# Patient Record
Sex: Male | Born: 1985 | Race: Black or African American | Hispanic: No | Marital: Single | Smoking: Former smoker
Health system: Southern US, Community
[De-identification: ages and names within clinical notes are randomized; demographics above are authoritative.]

## PROBLEM LIST (undated history)

## (undated) DIAGNOSIS — F32A Depression, unspecified: Secondary | ICD-10-CM

## (undated) HISTORY — PX: TOE SURGERY: SHX1073

## (undated) HISTORY — DX: Depression, unspecified: F32.A

## (undated) HISTORY — PX: EYE SURGERY: SHX253

---
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
## 2015-10-13 ENCOUNTER — Encounter (HOSPITAL_COMMUNITY): Payer: Self-pay | Admitting: Emergency Medicine

## 2015-10-13 ENCOUNTER — Emergency Department (HOSPITAL_COMMUNITY)
Admission: EM | Admit: 2015-10-13 | Discharge: 2015-10-13 | Disposition: A | Discharge status: Home / Self Care | Payer: Self-pay | Attending: Emergency Medicine | Admitting: Emergency Medicine

## 2015-10-13 DIAGNOSIS — F17210 Nicotine dependence, cigarettes, uncomplicated: Secondary | ICD-10-CM | POA: Insufficient documentation

## 2015-10-13 DIAGNOSIS — H00015 Hordeolum externum left lower eyelid: Secondary | ICD-10-CM | POA: Insufficient documentation

## 2015-10-13 DIAGNOSIS — R51 Headache: Secondary | ICD-10-CM | POA: Insufficient documentation

## 2015-10-13 DIAGNOSIS — H00016 Hordeolum externum left eye, unspecified eyelid: Secondary | ICD-10-CM

## 2015-10-13 MED ORDER — ERYTHROMYCIN 5 MG/GM OP OINT: TOPICAL_OINTMENT | OPHTHALMIC | Status: DC

## 2015-10-13 NOTE — Discharge Instructions (Signed)
Stye A stye is a bump on your eyelid caused by a bacterial infection. A stye can form inside the eyelid (internal stye) or outside the eyelid (external stye). An internal stye may be caused by an infected oil-producing gland inside your eyelid. An external stye may be caused by an infection at the base of your eyelash (hair follicle). Styes are very common. Anyone can get them at any age. They usually occur in just one eye, but you may have more than one in either eye.  CAUSES  The infection is almost always caused by bacteria called Staphylococcus aureus. This is a common type of bacteria that lives on your skin. RISK FACTORS You may be at higher risk for a stye if you have had one before. You may also be at higher risk if you have:  Diabetes.  Long-term illness.  Long-term eye redness.  A skin condition called seborrhea.  High fat levels in your blood (lipids). SIGNS AND SYMPTOMS  Eyelid pain is the most common symptom of a stye. Internal styes are more painful than external styes. Other signs and symptoms may include:  Painful swelling of your eyelid.  A scratchy feeling in your eye.  Tearing and redness of your eye.  Pus draining from the stye. DIAGNOSIS  Your health care provider may be able to diagnose a stye just by examining your eye. The health care provider may also check to make sure:  You do not have a fever or other signs of a more serious infection.  The infection has not spread to other parts of your eye or areas around your eye. TREATMENT  Most styes will clear up in a few days without treatment. In some cases, you may need to use antibiotic drops or ointment to prevent infection. Your health care provider may have to drain the stye surgically if your stye is:  Large.  Causing a lot of pain.  Interfering with your vision. This can be done using a thin blade or a needle.  HOME CARE INSTRUCTIONS   Take medicines only as directed by your health care  provider.  Apply a clean, warm compress to your eye for 10 minutes, 4 times a day.  Do not wear contact lenses or eye makeup until your stye has healed.  Do not try to pop or drain the stye. SEEK MEDICAL CARE IF:  You have chills or a fever.  Your stye does not go away after several days.  Your stye affects your vision.  Your eyeball becomes swollen, red, or painful. MAKE SURE YOU:  Understand these instructions.  Will watch your condition.  Will get help right away if you are not doing well or get worse.   This information is not intended to replace advice given to you by your health care provider. Make sure you discuss any questions you have with your health care provider.   Document Released: 01/02/2005 Document Revised: 04/15/2014 Document Reviewed: 07/09/2013 Elsevier Interactive Patient Education 2016 [employer]  

## 2015-10-13 NOTE — ED Notes (Signed)
PT DISCHARGED. INSTRUCTIONS AND PRESCRIPTION GIVEN. AAOX4. PT IN NO APPARENT DISTRESS. THE OPPORTUNITY TO ASK QUESTIONS WAS PROVIDED. 

## 2015-10-13 NOTE — ED Provider Notes (Signed)
CSN: [id number]     Arrival date & time 10/13/15  1605 History  By signing my name below, I, Placido SouLogan Joldersma, attest that this documentation has been prepared under the direction and in the presence of Arthor CaptainAbigail Chisom Muntean, PA-C. Electronically Signed: Placido SouLogan Joldersma, ED Scribe. 10/13/2015. 5:00 PM.   Chief Complaint  Patient presents with  . Eye Pain   The history is provided by the patient. No language interpreter was used.    HPI Comments: Tim Atkinson is a 30 y.o. male who presents to the Emergency Department complaining of a point of worsening swelling to his left lower eyelid x 5 days. He reports associated, moderate, pain and redness across the swollen region. His pain worsens with palpation. Pt states he has a SHx to the affected eye when he was a young child but is unsure of the operation performed. He denies a hx of similar symptoms. Pt denies any other associated symptoms at this time.    History reviewed. No pertinent past medical history. History reviewed. No pertinent past surgical history. No family history on file. Social History  Substance Use Topics  . Smoking status: Current Some Day Smoker    Types: Cigarettes  . Smokeless tobacco: None  . Alcohol Use: No    Review of Systems  HENT: Positive for facial swelling.   Eyes: Positive for pain.  Skin: Positive for color change.  Neurological: Positive for headaches.   Allergies  Review of patient's allergies indicates no known allergies.  Home Medications   Prior to Admission medications   Not on File   BP 117/65 mmHg  Pulse 73  Temp(Src) 99.6 F (37.6 C) (Oral)  Resp 16  SpO2 97%    Physical Exam  Constitutional: He is oriented to person, place, and time. He appears well-developed and well-nourished.  HENT:  Head: Normocephalic and atraumatic.  Eyes: Conjunctivae and EOM are normal. Pupils are equal, round, and reactive to light. Right eye exhibits no discharge. Left eye exhibits no discharge. No scleral  icterus.  Patient with swelling and redness of the lower left lid consistent with stye  Neck: Normal range of motion.  Cardiovascular: Normal rate.   Pulmonary/Chest: Effort normal. No respiratory distress.  Abdominal: Soft.  Musculoskeletal: Normal range of motion.  Neurological: He is alert and oriented to person, place, and time.  Skin: Skin is warm and dry.  Psychiatric: He has a normal mood and affect.  Nursing note and vitals reviewed.   ED Course  Procedures  DIAGNOSTIC STUDIES: Oxygen Saturation is 97% on RA, normal by my interpretation.    COORDINATION OF CARE: 4:57 PM Discussed next steps with pt. Pt verbalized understanding and is agreeable with the plan.   Labs Review Labs Reviewed - No data to display  Imaging Review No results found.   EKG Interpretation None      MDM   Final diagnoses:  Stye external, left    Patient with stye of the lower lid. Dc with romycin. Discussed return precautions. F/u with ophthalmology   I personally performed the services described in this documentation, which was scribed in my presence. The recorded information has been reviewed and is accurate.       Arthor Captainbigail Ragan Duhon, PA-C 10/13/15 2214  Mancel BaleElliott Wentz, MD 10/14/15 1126

## 2015-10-13 NOTE — ED Notes (Signed)
Pt reports stye to left eye causing blurred vision and headache.

## 2015-10-18 ENCOUNTER — Emergency Department (HOSPITAL_COMMUNITY)
Admission: EM | Admit: 2015-10-18 | Discharge: 2015-10-18 | Disposition: A | Discharge status: Home / Self Care | Payer: Self-pay | Attending: Emergency Medicine | Admitting: Emergency Medicine

## 2015-10-18 ENCOUNTER — Encounter (HOSPITAL_COMMUNITY): Payer: Self-pay | Admitting: *Deleted

## 2015-10-18 DIAGNOSIS — F17210 Nicotine dependence, cigarettes, uncomplicated: Secondary | ICD-10-CM | POA: Insufficient documentation

## 2015-10-18 DIAGNOSIS — H00016 Hordeolum externum left eye, unspecified eyelid: Secondary | ICD-10-CM

## 2015-10-18 DIAGNOSIS — H00015 Hordeolum externum left lower eyelid: Secondary | ICD-10-CM | POA: Insufficient documentation

## 2015-10-18 MED ORDER — POLYMYXIN B-TRIMETHOPRIM 10000-0.1 UNIT/ML-% OP SOLN: 1.0000 [drp] | OPHTHALMIC | Status: DC

## 2015-10-18 MED ORDER — NAPROXEN 500 MG PO TABS: 500.0000 mg | ORAL_TABLET | Freq: Two times a day (BID) | ORAL | Status: DC

## 2015-10-18 NOTE — ED Provider Notes (Signed)
CSN: [id number]     Arrival date & time 10/18/15  2217 History  By signing my name below, I, Vista Minkobert Ross, attest that this documentation has been prepared under the direction and in the presence of Audry Piliyler Eduardo Honor PA-C.  Electronically Signed: Renetta ChalkBobby Ross, ED Scribe. 10/18/2015. 11:16 PM    Chief Complaint  Patient presents with  . Eye Pain   The history is provided by the patient. No language interpreter was used.    HPI Comments: Tim Atkinson is a [age] y.o. 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 Pt also complains of associated headache due to the pain. Pt states that he was [facility] and was given Romycin but states that it has gotten worse. Pt states he has also been applying warm compresses with no relief. Pt has taken ibuprofen with no relief. Pt denies fever.   History reviewed. No pertinent past medical history. History reviewed. No pertinent past surgical history. No family history on file. Social History  Substance Use Topics  . Smoking status: Current Some Day Smoker    Types: Cigarettes  . Smokeless tobacco: None  . Alcohol Use: No    Review of Systems  Constitutional: Negative for fever.  Eyes: Positive for pain (left lower eyelid).  Neurological: Positive for headaches (due to pain).  All other systems reviewed and are negative.  Allergies  Review of patient's allergies indicates no known allergies.  Home Medications   Prior to Admission medications   Medication Sig Start Date End Date Taking? Authorizing Provider  erythromycin ophthalmic ointment Place a 1/2 inch ribbon of ointment into the lower eyelid 3 times a day . 10/13/15   Abigail Harris, PA-C   BP 109/67 mmHg  Pulse 58  Temp(Src) 98.6 F (37 C)  Resp 16  Ht 5\' 5"  (1.651 m)  Wt 142 lb 3 oz (64.496 kg)  BMI 23.66 kg/m2  SpO2 96%   Physical Exam  Constitutional: He is oriented to person,  place, and time. He appears well-developed and well-nourished. No distress.  HENT:  Head: Normocephalic and atraumatic.  Eyes: EOM are normal.  Left lower eyelid external stye.   Neck: Normal range of motion.  Cardiovascular: Normal rate and regular rhythm.   Pulmonary/Chest: Effort normal.  Abdominal: Soft.  Musculoskeletal: Normal range of motion.  Neurological: He is alert and oriented to person, place, and time.  Skin: Skin is warm and dry. He is not diaphoretic.  Psychiatric: He has a normal mood and affect. His behavior is normal. Judgment and thought content normal.  Nursing note and vitals reviewed.  ED Course  Procedures  DIAGNOSTIC STUDIES: Oxygen Saturation is 96% on RA, normal by my interpretation.  COORDINATION OF CARE: 11:08 PM-Will order continued use of Abx and heat. Follow up with opthalmologist,  Discussed treatment plan with pt at bedside and pt agreed to plan.   Labs Review Labs Reviewed - No data to display  Imaging Review No results found. I have personally reviewed and evaluated these images and lab results as part of my medical decision-making.   EKG Interpretation None      MDM  I have reviewed the relevant previous healthcare records. I obtained HPI from historian.  ED Course:  Assessment: Pt is a 30yM who presents with left lower eyelid stye. Seen previously in ED and given erythromycin and Optho referral On exam, pt  in NAD. Nontoxic/nonseptic appearing. VSS. Afebrile. No purulence on exam. No erythema. Due to patient concern for MRSA, consulted with Pharmacy and will switch to Polytrim solution. Pt states he was not given. Optho referral last time, so will provide today. Plan is to DC home with follow up to optho. At time of discharge, Patient is in no acute distress. Vital Signs are stable. Patient is able to ambulate. Patient able to tolerate PO.    Disposition/Plan:  DC home Additional Verbal discharge instructions given and discussed with  patient.  Pt Instructed to f/u with Optho in the next week for evaluation and treatment of symptoms. Return precautions given Pt acknowledges and agrees with plan  Supervising Physician Lyndal Pulley, MD   Final diagnoses:  Stye external, left   I personally performed the services described in this documentation, which was scribed in my presence. The recorded information has been reviewed and is accurate.   Audry Pili, PA-C 10/18/15 2331  Dione Booze, MD 10/18/15 [phone]

## 2015-10-18 NOTE — Discharge Instructions (Signed)
Please read and follow all provided instructions.  Your diagnoses today include:  1. Stye external, left    Tests performed today include:  Vital signs. See below for your results today.   Medications prescribed:   Take as prescribed   Home care instructions:  Follow any educational materials contained in this packet.  Follow-up instructions: Please follow-up with your Opthomology for further evaluation of symptoms and treatment   Return instructions:   Please return to the Emergency Department if you do not get better, if you get worse, or new symptoms OR  - Fever (temperature greater than 101.54F)  - Bleeding that does not stop with holding pressure to the area    -Severe pain (please note that you may be more sore the day after your accident)  - Chest Pain  - Difficulty breathing  - Severe nausea or vomiting  - Inability to tolerate food and liquids  - Passing out  - Skin becoming red around your wounds  - Change in mental status (confusion or lethargy)  - New numbness or weakness     Please return if you have any other emergent concerns.  Additional Information:  Your vital signs today were: BP 109/67 mmHg   Pulse 58   Temp(Src) 98.6 F (37 C)   Resp 16   Ht 5\' 5"  (1.651 m)   Wt 64.496 kg   BMI 23.66 kg/m2   SpO2 96% If your blood pressure (BP) was elevated above 135/85 this visit, please have this repeated by your doctor within one month. ---------------

## 2015-10-18 NOTE — ED Notes (Signed)
The pt has a stye on his lt lower eyelid that has been there since July 6th  C/o a headache

## 2015-10-18 NOTE — ED Notes (Signed)
Patient able to ambulate independently  

## 2015-10-19 ENCOUNTER — Telehealth: Payer: Self-pay | Admitting: *Deleted

## 2015-10-19 NOTE — Telephone Encounter (Signed)
Pharmacy called related to Rx: trimethoprim-polymyxin b (POLYTRIM) ophthalmic solution being on back order .Marland Kitchen.Marland Kitchen.EDCM clarified with EDP to change Rx to: Maxitrol 5mg  as suggested by Pharm D.

## 2016-03-13 ENCOUNTER — Encounter (HOSPITAL_COMMUNITY): Payer: Self-pay

## 2016-03-13 ENCOUNTER — Emergency Department (HOSPITAL_COMMUNITY)
Admission: EM | Admit: 2016-03-13 | Discharge: 2016-03-13 | Disposition: A | Discharge status: Home / Self Care | Payer: Self-pay | Attending: Emergency Medicine | Admitting: Emergency Medicine

## 2016-03-13 DIAGNOSIS — F17210 Nicotine dependence, cigarettes, uncomplicated: Secondary | ICD-10-CM | POA: Insufficient documentation

## 2016-03-13 DIAGNOSIS — R112 Nausea with vomiting, unspecified: Secondary | ICD-10-CM | POA: Insufficient documentation

## 2016-03-13 DIAGNOSIS — R197 Diarrhea, unspecified: Secondary | ICD-10-CM | POA: Insufficient documentation

## 2016-03-13 LAB — COMPREHENSIVE METABOLIC PANEL
ALBUMIN: 4.5 g/dL (ref 3.5–5.0)
ALK PHOS: 42 U/L (ref 38–126)
ALT: 22 U/L (ref 17–63)
ANION GAP: 12 (ref 5–15)
AST: 27 U/L (ref 15–41)
BILIRUBIN TOTAL: 0.8 mg/dL (ref 0.3–1.2)
BUN: 14 mg/dL (ref 6–20)
CALCIUM: 9.8 mg/dL (ref 8.9–10.3)
CO2: 25 mmol/L (ref 22–32)
CREATININE: 1.12 mg/dL (ref 0.61–1.24)
Chloride: 100 mmol/L — ABNORMAL LOW (ref 101–111)
GFR calc non Af Amer: >60 mL/min (ref >60)
GLUCOSE: 118 mg/dL — AB (ref 65–99)
Potassium: 3.5 mmol/L (ref 3.5–5.1)
SODIUM: 137 mmol/L (ref 135–145)
TOTAL PROTEIN: 7.5 g/dL (ref 6.5–8.1)

## 2016-03-13 LAB — URINALYSIS, ROUTINE W REFLEX MICROSCOPIC
BILIRUBIN URINE: NEGATIVE
Bacteria, UA: NONE SEEN
Glucose, UA: NEGATIVE mg/dL
Hgb urine dipstick: NEGATIVE
Ketones, ur: 80 mg/dL — AB
Leukocytes, UA: NEGATIVE
NITRITE: NEGATIVE
PH: 8 (ref 5.0–8.0)
Protein, ur: 100 mg/dL — AB
SPECIFIC GRAVITY, URINE: 1.03 (ref 1.005–1.030)
Squamous Epithelial / LPF: NONE SEEN

## 2016-03-13 LAB — CBC
HCT: 46.8 % (ref 39.0–52.0)
HEMOGLOBIN: 17.2 g/dL — AB (ref 13.0–17.0)
MCH: 29.3 pg (ref 26.0–34.0)
MCHC: 36.8 g/dL — AB (ref 30.0–36.0)
MCV: 79.7 fL (ref 78.0–100.0)
PLATELETS: 221 10*3/uL (ref 150–400)
RBC: 5.87 MIL/uL — ABNORMAL HIGH (ref 4.22–5.81)
RDW: 11.9 % (ref 11.5–15.5)
WBC: 10.7 10*3/uL — ABNORMAL HIGH (ref 4.0–10.5)

## 2016-03-13 LAB — LIPASE, BLOOD: Lipase: 13 U/L (ref 11–51)

## 2016-03-13 LAB — I-STAT CG4 LACTIC ACID, ED: LACTIC ACID, VENOUS: 1.83 mmol/L (ref 0.5–1.9)

## 2016-03-13 MED ORDER — ONDANSETRON HCL 4 MG PO TABS: 4.0000 mg | ORAL_TABLET | Freq: Three times a day (TID) | ORAL | 0 refills | Status: AC | PRN

## 2016-03-13 MED ORDER — ONDANSETRON 4 MG PO TBDP
ORAL_TABLET | ORAL | Status: AC
  Filled 2016-03-13: qty 1

## 2016-03-13 MED ORDER — GI COCKTAIL ~~LOC~~
30.0000 mL | Freq: Once | ORAL | Status: AC
  Administered 2016-03-13: 30 mL via ORAL
  Filled 2016-03-13: qty 30

## 2016-03-13 MED ORDER — ONDANSETRON 4 MG PO TBDP
4.0000 mg | ORAL_TABLET | Freq: Once | ORAL | Status: AC | PRN
  Administered 2016-03-13: 4 mg via ORAL

## 2016-03-13 NOTE — ED Notes (Signed)
Gave pt a cup of water 

## 2016-03-13 NOTE — ED Triage Notes (Addendum)
Patient complains of lower abdominal pain with cramping since yesterday. [facility] prior to [facility] MD. No vomiting on arrival. Has had multiple episodes of vomiting and diarrhea since yesterday evening

## 2016-03-13 NOTE — ED Provider Notes (Addendum)
[facility]-EMERGENCY DEPT Provider Note   CSN: [id number] Arrival date & time: 03/13/16  1621  By signing my name below, I, Tim Atkinson, attest that this documentation has been prepared under the direction and in the presence of physician practitioner, Tim ConnPedro Eduardo Cherolyn Behrle, MD. Electronically Signed: Linna Darnerussell Atkinson, Scribe. 03/13/2016. 7:15 PM.  History   Chief Complaint Chief Complaint  Patient presents with  . Abdominal Pain  . Emesis  . Diarrhea    The history is provided by the patient. No language interpreter was used.    HPI Comments: Tim Atkinson is a 30 y.o. male who presents to the Emergency Department complaining of sudden onset, intermittent, nausea and vomiting beginning yesterday. He reports associated diarrhea and subjective fever and states he started experiencing LUQ abdominal cramping today. He states his abdominal pain is mild currently. He states his emesis is mostly liquid but he has also vomited food contents. Pt reports he ate some chili and crackers at work several hours ago and immediately vomited. He reports a burning sensation in his chest after vomiting today. He notes his last episode of diarrhea was yesterday. No known sick contacts with similar symptoms. No recent unusual food intake; he states he ate a burger from his workplace and a [employer] a few days ago. He denies hematochezia, hematemesis, or any other associated symptoms.  History reviewed. No pertinent past medical history.  There are no active problems to display for this patient.   History reviewed. No pertinent surgical history.     Home Medications    Prior to Admission medications   Medication Sig Start Date End Date Taking? Authorizing Provider  erythromycin ophthalmic ointment Place a 1/2 inch ribbon of ointment into the lower eyelid 3 times a day . 10/13/15   Arthor CaptainAbigail Harris, PA-C  naproxen (NAPROSYN) 500 MG tablet Take 1 tablet (500 mg total) by mouth 2 (two) times daily. 10/18/15    Audry Piliyler Mohr, PA-C  ondansetron (ZOFRAN) 4 MG tablet Take 1 tablet (4 mg total) by mouth every 8 (eight) hours as needed for nausea or vomiting. 03/13/16 03/16/16  Tim ConnPedro Eduardo Tim Genco, MD  trimethoprim-polymyxin b (POLYTRIM) ophthalmic solution Place 1 drop into the left eye every 4 (four) hours. 10/18/15   Audry Piliyler Mohr, PA-C    Family History No family history on file.  Social History Social History  Substance Use Topics  . Smoking status: Current Some Day Smoker    Types: Cigarettes  . Smokeless tobacco: Not on file  . Alcohol use No     Allergies   Patient has no known allergies.   Review of Systems Review of Systems  A complete 10 system review of systems was obtained and all systems are negative except as noted in the HPI and PMH.   Physical Exam Updated Vital Signs BP 123/91 (BP Location: Right Arm)   Pulse 63   Temp 98.9 F (37.2 C) (Oral)   Resp 18   SpO2 99%   Physical Exam  Constitutional: He is oriented to person, place, and time. He appears well-developed and well-nourished. No distress.  HENT:  Head: Normocephalic and atraumatic.  Nose: Nose normal.  Mouth/Throat: Oropharynx is clear and moist and mucous membranes are normal.  Eyes: Conjunctivae and EOM are normal. Pupils are equal, round, and reactive to light. Right eye exhibits no discharge. Left eye exhibits no discharge. No scleral icterus.  Neck: Normal range of motion. Neck supple.  Cardiovascular: Normal rate and regular rhythm.  Exam reveals no gallop and  no friction rub.   No murmur heard. Pulmonary/Chest: Effort normal and breath sounds normal. No stridor. No respiratory distress. He has no rales.  Abdominal: Soft. He exhibits no distension. There is tenderness in the left upper quadrant. There is no rigidity, no rebound, no guarding and no CVA tenderness.  Musculoskeletal: He exhibits no edema or tenderness.  Neurological: He is alert and oriented to person, place, and time.  Skin: Skin is warm  and dry. No rash noted. He is not diaphoretic. No erythema.  Psychiatric: He has a normal mood and affect.  Vitals reviewed.     ED Treatments / Results  Labs (all labs ordered are listed, but only abnormal results are displayed) Labs Reviewed  COMPREHENSIVE METABOLIC PANEL - Abnormal; Notable for the following:       Result Value   Chloride 100 (*)    Glucose, Bld 118 (*)    All other components within normal limits  CBC - Abnormal; Notable for the following:    WBC 10.7 (*)    RBC 5.87 (*)    Hemoglobin 17.2 (*)    MCHC 36.8 (*)    All other components within normal limits  URINALYSIS, ROUTINE W REFLEX MICROSCOPIC - Abnormal; Notable for the following:    Color, Urine AMBER (*)    APPearance TURBID (*)    Ketones, ur 80 (*)    Protein, ur 100 (*)    All other components within normal limits  LIPASE, BLOOD  I-STAT CG4 LACTIC ACID, ED    EKG  EKG Interpretation None       Radiology No results found.  Procedures Procedures (including critical care time)  DIAGNOSTIC STUDIES: Oxygen Saturation is 99% on RA, normal by my interpretation.    COORDINATION OF CARE: 7:24 PM Discussed treatment plan with pt at bedside and pt agreed to plan.  Medications Ordered in ED Medications  ondansetron (ZOFRAN-ODT) disintegrating tablet 4 mg (4 mg Oral Given 03/13/16 1633)  gi cocktail (Maalox,Lidocaine,Donnatal) (30 mLs Oral Given 03/13/16 1953)     Initial Impression / Assessment and Plan / ED Course  I have reviewed the triage vital signs and the nursing notes.  Pertinent labs & imaging results that were available during my care of the patient were reviewed by me and considered in my medical decision making (see chart for details).  Clinical Course as of Mar 13 2041  Wed Mar 13, 2016  1923 Afebrile, well-hydrated, nontoxic appearing. Abdomen not peritonitic. Labs reassuring without evidence of a K wire electrolyte derangements. CBC with mild leukocytosis however  hemoconcentrated. UA without infection.   Tolerating PO intake.  Likely viral process vs food poisoning. Doubt serious bacterial infection or intra-abdominal inflammatory process requiring advanced imaging or antibiotic treatment at this time.  The patient is safe for discharge with strict return precautions.   [PC]    Clinical Course User Index [PC] Tim Conn, MD      Final Clinical Impressions(s) / ED Diagnoses   Final diagnoses:  Nausea vomiting and diarrhea   Disposition: Discharge  Condition: Good  I have discussed the results, Dx and Tx plan with the patient who expressed understanding and agree(s) with the plan. Discharge instructions discussed at great length. The patient was given strict return precautions who verbalized understanding of the instructions. No further questions at time of discharge.    New Prescriptions   ONDANSETRON (ZOFRAN) 4 MG TABLET    Take 1 tablet (4 mg total) by mouth every 8 (eight) hours  as needed for nausea or vomiting.     I personally performed the services described in this documentation, which was scribed in my presence. The recorded information has been reviewed and is accurate.      Tim ConnPedro Eduardo Akul Leggette, MD 03/13/16 2046

## 2016-06-25 ENCOUNTER — Emergency Department (HOSPITAL_COMMUNITY)
Admission: EM | Admit: 2016-06-25 | Discharge: 2016-06-25 | Disposition: A | Discharge status: Home / Self Care | Payer: Worker's Compensation | Attending: Emergency Medicine | Admitting: Emergency Medicine

## 2016-06-25 ENCOUNTER — Encounter (HOSPITAL_COMMUNITY): Payer: Self-pay | Admitting: Emergency Medicine

## 2016-06-25 ENCOUNTER — Emergency Department (HOSPITAL_COMMUNITY): Payer: Worker's Compensation

## 2016-06-25 DIAGNOSIS — S61305A Unspecified open wound of left ring finger with damage to nail, initial encounter: Secondary | ICD-10-CM | POA: Diagnosis not present

## 2016-06-25 DIAGNOSIS — Y939 Activity, unspecified: Secondary | ICD-10-CM | POA: Diagnosis not present

## 2016-06-25 DIAGNOSIS — Y990 Civilian activity done for income or pay: Secondary | ICD-10-CM | POA: Diagnosis not present

## 2016-06-25 DIAGNOSIS — Y929 Unspecified place or not applicable: Secondary | ICD-10-CM | POA: Diagnosis not present

## 2016-06-25 DIAGNOSIS — S6992XA Unspecified injury of left wrist, hand and finger(s), initial encounter: Secondary | ICD-10-CM | POA: Diagnosis present

## 2016-06-25 DIAGNOSIS — W268XXA Contact with other sharp object(s), not elsewhere classified, initial encounter: Secondary | ICD-10-CM | POA: Diagnosis not present

## 2016-06-25 DIAGNOSIS — F17210 Nicotine dependence, cigarettes, uncomplicated: Secondary | ICD-10-CM | POA: Insufficient documentation

## 2016-06-25 MED ORDER — BACITRACIN ZINC 500 UNIT/GM EX OINT
TOPICAL_OINTMENT | Freq: Once | CUTANEOUS | Status: AC
  Administered 2016-06-25: 1 via TOPICAL
  Filled 2016-06-25: qty 0.9

## 2016-06-25 MED ORDER — LIDOCAINE HCL (PF) 1 % IJ SOLN
30.0000 mL | Freq: Once | INTRAMUSCULAR | Status: AC
  Administered 2016-06-25: 30 mL
  Filled 2016-06-25: qty 30

## 2016-06-25 MED ORDER — HYDROCODONE-ACETAMINOPHEN 5-325 MG PO TABS
1.0000 | ORAL_TABLET | Freq: Once | ORAL | Status: AC
  Administered 2016-06-25: 1 via ORAL
  Filled 2016-06-25: qty 1

## 2016-06-25 NOTE — ED Provider Notes (Signed)
[facility] Provider Note   CSN: [id number] Arrival date & time: 06/25/16  1611  By signing my name below, I, Sonum Patel, attest that this documentation has been prepared under the direction and in the presence of Rise Mu, PA-C. Electronically Signed: Sonum Patel, Scribe. 06/25/16. 5:24 PM.  History   Chief Complaint No chief complaint on file.   The history is provided by the patient. No language interpreter was used.     HPI Comments: Tim Atkinson is a 31 y.o. male who presents to the Emergency Department complaining of a laceration to the left 4th digit that occurred earlier today. Patient states he was at work when he cut his finger on a vegetable slicer. He reports associated pain to the affected area. His last tetanus is UTD. He denies numbness, decreased range of motion.   No past medical history on file.  There are no active problems to display for this patient.   No past surgical history on file.     Home Medications    Prior to Admission medications   Medication Sig Start Date End Date Taking? Authorizing Provider  erythromycin ophthalmic ointment Place a 1/2 inch ribbon of ointment into the lower eyelid 3 times a day . 10/13/15   Arthor Captain, PA-C  naproxen (NAPROSYN) 500 MG tablet Take 1 tablet (500 mg total) by mouth 2 (two) times daily. 10/18/15   Audry Pili, PA-C  trimethoprim-polymyxin b (POLYTRIM) ophthalmic solution Place 1 drop into the left eye every 4 (four) hours. 10/18/15   Audry Pili, PA-C    Family History No family history on file.  Social History Social History  Substance Use Topics  . Smoking status: Current Some Day Smoker    Types: Cigarettes  . Smokeless tobacco: Not on file  . Alcohol use No     Allergies   Patient has no known allergies.   Review of Systems Review of Systems  Constitutional: Negative for chills and fever.  Musculoskeletal: Positive for myalgias.  Skin: Positive for wound.    Neurological: Negative for weakness and numbness.     Physical Exam Updated Vital Signs BP 139/66 (BP Location: Left Arm)   Pulse (!) 56   Temp 98.7 F (37.1 C) (Oral)   Resp 16   SpO2 100%   Physical Exam  Constitutional: He is oriented to person, place, and time. He appears well-developed and well-nourished.  HENT:  Head: Normocephalic and atraumatic.  Cardiovascular: Normal rate and intact distal pulses.   Pulmonary/Chest: Effort normal.  Musculoskeletal: Normal range of motion.  Avulsion wound to the left ring finger that involves the nail. Partial nail bed intact. No skin flap to reapproximate. No bone visible. Full range of motion with flexion and extension of the DIP and PIP of the fourth digit. Cap refill normal. Sensation intact. Radial pulses 2+ bilaterally.  Patient does have second small avulsion wound to the left dorsal region of the of the PIP. Full range of motion. Bleeding controlled.  Neurological: He is alert and oriented to person, place, and time.  Skin: Skin is warm and dry. Capillary refill takes less than 2 seconds.  Psychiatric: He has a normal mood and affect.  Nursing note and vitals reviewed.          ED Treatments / Results  DIAGNOSTIC STUDIES: Oxygen Saturation is 100% on RA, normal by my interpretation.    COORDINATION OF CARE: 5:24 PM Discussed treatment plan with pt at bedside and pt agreed to plan.  Labs (all labs ordered are listed, but only abnormal results are displayed) Labs Reviewed - No data to display  EKG  EKG Interpretation None       Radiology Dg Hand Complete Left  Result Date: 06/25/2016 CLINICAL DATA:  Laceration left ring finger. EXAM: LEFT HAND - COMPLETE 3+ VIEW COMPARISON:  None available FINDINGS: Minor soft tissue injury suspected of the left fourth digit distally. Normal alignment without acute osseous finding. No joint abnormality. No radiopaque or metallic foreign body. IMPRESSION: Minor soft tissue  injury suspected of the left fourth digit radiographically. No other acute finding. Electronically Signed   By: Judie Petit.  Shick M.D.   On: 06/25/2016 17:44    Procedures .Nerve Block Date/Time: 06/25/2016 6:27 PM Performed by: Rise Mu Authorized by: Demetrios Loll T   Consent:    Consent obtained:  Verbal   Consent given by:  Patient Indications:    Indications:  Pain relief Location:    Body area:  Upper extremity   Upper extremity nerve:  Metacarpal   Laterality:  Left Pre-procedure details:    Skin preparation:  Povidone-iodine   Preparation: Patient was prepped and draped in usual sterile fashion   Skin anesthesia (see MAR for exact dosages):    Skin anesthesia method:  None Procedure details (see MAR for exact dosages):    Block needle gauge:  25 G   Anesthetic injected:  Lidocaine 1% w/o epi   Injection procedure:  Anatomic landmarks identified and negative aspiration for blood Post-procedure details:    Outcome:  Pain relieved   Patient tolerance of procedure:  Tolerated well, no immediate complications    (including critical care time)  Medications Ordered in ED Medications  HYDROcodone-acetaminophen (NORCO/VICODIN) 5-325 MG per tablet 1 tablet (1 tablet Oral Given 06/25/16 1747)  lidocaine (PF) (XYLOCAINE) 1 % injection 30 mL (30 mLs Infiltration Given 06/25/16 1748)  bacitracin ointment (1 application Topical Given 06/25/16 1840)     Initial Impression / Assessment and Plan / ED Course  I have reviewed the triage vital signs and the nursing notes.  Pertinent labs & imaging results that were available during my care of the patient were reviewed by me and considered in my medical decision making (see chart for details).     Tetanus utd. Laceration occurred < 12 hours prior to repair. Wound is more of an avulsion. There is no tissue to approximate for suturing. The nailbed is intact and do not feel that removing the rest of the nailbed is appropriate.  X-ray shows no bony involvement. Patient is neurovascularly intact with normal strength and range of motion. Digit block was performed to allow for adequate cleaning. Pressure irrigation was performed. Nonadherent bulky dressing was applied. Second wound was cleaned and dressed. This not require suturing as well due to the avulsion nature. I have discussed the patient with Dr. Donnald Garre who feels that no indication for suturing at this time. Agrees with the above plan of care and recommends outpatient follow-up with hand surgery. Patient verbalized understanding the plan of care. QUESTIONS were answered prior to discharge. Have encouraged him to call orthopedics tomorrow for follow-up this week. Have given strict strict return precautions including signs of developing infection. Final Clinical Impressions(s) / ED Diagnoses   Final diagnoses:  Finger injury, left, initial encounter    New Prescriptions Discharge Medication List as of 06/25/2016  6:46 PM     I personally performed the services described in this documentation, which was scribed in my presence. The  recorded information has been reviewed and is accurate.    Rise MuKenneth T Sharlyne Koeneman, PA-C 06/26/16 1552    Arby BarretteMarcy Pfeiffer, MD 07/06/16 1350

## 2016-06-25 NOTE — Discharge Instructions (Signed)
Please keep wound clean and dry. Change dressing every day. Motrin and Tylenol for pain. Follow-up with Dr. Amanda PeaGramig would call for an appointment tomorrow. If he develop any signs of infection including worsening pain, purulent drainage, numbness, inability to bend her finger please return to the ED immediately.

## 2016-06-25 NOTE — ED Notes (Signed)
Pt ambulatory and independent at discharge.  Verbalized understanding of discharge instructions 

## 2016-06-25 NOTE — ED Triage Notes (Signed)
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  Complete layer of skin off. Left hand ring finger. Bleeding controlled at this time

## 2016-12-18 ENCOUNTER — Emergency Department (HOSPITAL_COMMUNITY)
Admission: EM | Admit: 2016-12-18 | Discharge: 2016-12-19 | Disposition: A | Discharge status: Home / Self Care | Payer: Self-pay | Attending: Emergency Medicine | Admitting: Emergency Medicine

## 2016-12-18 ENCOUNTER — Emergency Department (HOSPITAL_COMMUNITY): Payer: Self-pay

## 2016-12-18 ENCOUNTER — Encounter (HOSPITAL_COMMUNITY): Payer: Self-pay

## 2016-12-18 DIAGNOSIS — F17210 Nicotine dependence, cigarettes, uncomplicated: Secondary | ICD-10-CM | POA: Insufficient documentation

## 2016-12-18 DIAGNOSIS — K529 Noninfective gastroenteritis and colitis, unspecified: Secondary | ICD-10-CM | POA: Insufficient documentation

## 2016-12-18 DIAGNOSIS — Z79899 Other long term (current) drug therapy: Secondary | ICD-10-CM | POA: Insufficient documentation

## 2016-12-18 LAB — CBC
HEMATOCRIT: 46.7 % (ref 39.0–52.0)
Hemoglobin: 16.9 g/dL (ref 13.0–17.0)
MCH: 29.3 pg (ref 26.0–34.0)
MCHC: 36.2 g/dL — ABNORMAL HIGH (ref 30.0–36.0)
MCV: 80.9 fL (ref 78.0–100.0)
Platelets: 249 10*3/uL (ref 150–400)
RBC: 5.77 MIL/uL (ref 4.22–5.81)
RDW: 12.3 % (ref 11.5–15.5)
WBC: 18.1 10*3/uL — AB (ref 4.0–10.5)

## 2016-12-18 LAB — COMPREHENSIVE METABOLIC PANEL
ALT: 30 U/L (ref 17–63)
ANION GAP: 13 (ref 5–15)
AST: 43 U/L — ABNORMAL HIGH (ref 15–41)
Albumin: 5.3 g/dL — ABNORMAL HIGH (ref 3.5–5.0)
Alkaline Phosphatase: 55 U/L (ref 38–126)
BUN: 15 mg/dL (ref 6–20)
CHLORIDE: 103 mmol/L (ref 101–111)
CO2: 24 mmol/L (ref 22–32)
Calcium: 10 mg/dL (ref 8.9–10.3)
Creatinine, Ser: 1.2 mg/dL (ref 0.61–1.24)
Glucose, Bld: 118 mg/dL — ABNORMAL HIGH (ref 65–99)
POTASSIUM: 3.8 mmol/L (ref 3.5–5.1)
Sodium: 140 mmol/L (ref 135–145)
TOTAL PROTEIN: 8.2 g/dL — AB (ref 6.5–8.1)
Total Bilirubin: 1 mg/dL (ref 0.3–1.2)

## 2016-12-18 LAB — URINALYSIS, ROUTINE W REFLEX MICROSCOPIC
BILIRUBIN URINE: NEGATIVE
Bacteria, UA: NONE SEEN
Glucose, UA: NEGATIVE mg/dL
HGB URINE DIPSTICK: NEGATIVE
KETONES UR: 20 mg/dL — AB
LEUKOCYTES UA: NEGATIVE
NITRITE: NEGATIVE
PH: 7 (ref 5.0–8.0)
Protein, ur: 30 mg/dL — AB
SPECIFIC GRAVITY, URINE: 1.025 (ref 1.005–1.030)

## 2016-12-18 LAB — LIPASE, BLOOD: LIPASE: 18 U/L (ref 11–51)

## 2016-12-18 MED ORDER — IOPAMIDOL (ISOVUE-300) INJECTION 61%
INTRAVENOUS | Status: AC
  Filled 2016-12-18: qty 100

## 2016-12-18 MED ORDER — PANTOPRAZOLE SODIUM 40 MG IV SOLR
40.0000 mg | Freq: Once | INTRAVENOUS | Status: AC
  Administered 2016-12-18: 40 mg via INTRAVENOUS
  Filled 2016-12-18: qty 40

## 2016-12-18 MED ORDER — SODIUM CHLORIDE 0.9 % IV BOLUS (SEPSIS)
1000.0000 mL | Freq: Once | INTRAVENOUS | Status: AC
  Administered 2016-12-18: 1000 mL via INTRAVENOUS

## 2016-12-18 MED ORDER — ONDANSETRON HCL 4 MG/2ML IJ SOLN
4.0000 mg | Freq: Once | INTRAMUSCULAR | Status: AC
  Administered 2016-12-18: 4 mg via INTRAVENOUS
  Filled 2016-12-18: qty 2

## 2016-12-18 MED ORDER — KETOROLAC TROMETHAMINE 30 MG/ML IJ SOLN
30.0000 mg | Freq: Once | INTRAMUSCULAR | Status: AC
  Administered 2016-12-18: 30 mg via INTRAVENOUS
  Filled 2016-12-18: qty 1

## 2016-12-18 MED ORDER — IOPAMIDOL (ISOVUE-300) INJECTION 61%
100.0000 mL | Freq: Once | INTRAVENOUS | Status: AC | PRN
  Administered 2016-12-18: 100 mL via INTRAVENOUS

## 2016-12-18 NOTE — ED Triage Notes (Signed)
Patient woke with c/o abdominal cramping. Patient's significant other states that the patient was drinking alcohol last night and woke today with cramping and nausea.

## 2016-12-18 NOTE — ED Notes (Signed)
Pt is aware of the urine sample needed.  I provided pt with a urinal,

## 2016-12-18 NOTE — ED Provider Notes (Signed)
Tim Atkinson Provider Note   CSN: [id number] Arrival date & time: 12/18/16  1313     History   Chief Complaint Chief Complaint  Patient presents with  . Emesis  . Abdominal Pain    HPI Tim Atkinson is a 31 y.o. male.  31 year old male with no significant past medical history presents to the emergency department for evaluation of abdominal pain. Patient states that he awoke with. Umbilical abdominal discomfort. It has been constant and waxing and waning in severity. He describes the pain as cramping. He denies taking any medications for his symptoms. He has had associated nausea and denies eating or drinking secondary to worsening nausea and discomfort. He had some vomiting earlier today. He cannot recall passing flatus and did not have a bowel movement today. He was out late last evening drinking alcohol. He has not had any known fevers, no urinary symptoms. No history of abdominal surgeries.   The history is provided by the patient. No language interpreter was used.  Emesis   Associated symptoms include abdominal pain.  Abdominal Pain   Associated symptoms include vomiting.    History reviewed. No pertinent past medical history.  There are no active problems to display for this patient.   Past Surgical History:  Procedure Laterality Date  . EYE SURGERY    . TOE SURGERY         Home Medications    Prior to Admission medications   Medication Sig Start Date End Date Taking? Authorizing Provider  ibuprofen (ADVIL,MOTRIN) 200 MG tablet Take 200 mg by mouth every 6 (six) hours as needed for moderate pain.   Yes [provider]  dicyclomine (BENTYL) 20 MG tablet Take 1 tablet (20 mg total) by mouth 2 (two) times daily. Take for abdominal pain/cramping, as needed 12/19/16   Antony Madura, PA-C  erythromycin ophthalmic ointment Place a 1/2 inch ribbon of ointment into the lower eyelid 3 times a day . Patient not taking: Reported on 12/18/2016 10/13/15    Arthor Captain, PA-C  naproxen (NAPROSYN) 500 MG tablet Take 1 tablet (500 mg total) by mouth 2 (two) times daily. Patient not taking: Reported on 12/18/2016 10/18/15   Audry Pili, PA-C  promethazine (PHENERGAN) 25 MG tablet Take 1 tablet (25 mg total) by mouth every 6 (six) hours as needed for nausea or vomiting. 12/19/16   Antony Madura, PA-C  trimethoprim-polymyxin b (POLYTRIM) ophthalmic solution Place 1 drop into the left eye every 4 (four) hours. Patient not taking: Reported on 12/18/2016 10/18/15   Audry Pili, PA-C    Family History Family History  Problem Relation Age of Onset  . Diverticulitis Mother     Social History Social History  Substance Use Topics  . Smoking status: Current Some Day Smoker    Types: Cigarettes  . Smokeless tobacco: Never Used  . Alcohol use Yes     Comment: occasionally     Allergies   Patient has no known allergies.   Review of Systems Review of Systems  Gastrointestinal: Positive for abdominal pain and vomiting.  Ten systems reviewed and are negative for acute change, except as noted in the HPI.    Physical Exam Updated Vital Signs BP (!) 93/54   Pulse 78   Temp (!) 97.4 F (36.3 C) (Oral)   Resp 19   Ht  (1.651 m)   Wt 68 kg (150 lb)   SpO2 99%   BMI 24.96 kg/m   Physical Exam  Constitutional: He is oriented  to person, place, and time. He appears well-developed and well-nourished. No distress.  Appears fatigued, but nontoxic.  HENT:  Head: Normocephalic and atraumatic.  Eyes: Conjunctivae and EOM are normal. No scleral icterus.  Neck: Normal range of motion.  Cardiovascular: Regular rhythm and intact distal pulses.   Bradycardia  Pulmonary/Chest: Effort normal. No respiratory distress. He has no wheezes. He has no rales.  Lungs CTAB  Abdominal: Soft. He exhibits no mass. There is tenderness. There is no guarding.  Soft, nondistended abdomen with focal tenderness in the right lower quadrant and mildly in the right  midabdomen. Negative Murphy sign. No peritoneal signs, masses, guarding.  Musculoskeletal: Normal range of motion.  Neurological: He is alert and oriented to person, place, and time. He exhibits normal muscle tone. Coordination normal.  GCS 15. Patient moving all extremities.  Skin: Skin is warm and dry. No rash noted. He is not diaphoretic. No erythema. No pallor.  Psychiatric: He has a normal mood and affect. His behavior is normal.  Nursing note and vitals reviewed.    ED Treatments / Results  Labs (all labs ordered are listed, but only abnormal results are displayed) Labs Reviewed  COMPREHENSIVE METABOLIC PANEL - Abnormal; Notable for the following:       Result Value   Glucose, Bld 118 (*)    Total Protein 8.2 (*)    Albumin 5.3 (*)    AST 43 (*)    All other components within normal limits  CBC - Abnormal; Notable for the following:    WBC 18.1 (*)    MCHC 36.2 (*)    All other components within normal limits  URINALYSIS, ROUTINE W REFLEX MICROSCOPIC - Abnormal; Notable for the following:    Ketones, ur 20 (*)    Protein, ur 30 (*)    Squamous Epithelial / LPF 0-5 (*)    All other components within normal limits  LIPASE, BLOOD    EKG  EKG Interpretation None       Radiology Ct Abdomen Pelvis W Contrast  Result Date: 12/18/2016 CLINICAL DATA:  Abdominal cramping and nausea. Patient had been drinking last evening. EXAM: CT ABDOMEN AND PELVIS WITH CONTRAST TECHNIQUE: Multidetector CT imaging of the abdomen and pelvis was performed using the standard protocol following bolus administration of intravenous contrast. CONTRAST:  ISOVUE-300 IOPAMIDOL (ISOVUE-300) INJECTION 61% COMPARISON:  None. FINDINGS: Lower chest: No acute abnormality. Hepatobiliary: Mild periportal edema which can be seen in hypervolemia, passive hepatic congestion or hepatitis (alcohol, viral or toxic). No space-occupying mass of the liver. The gallbladder is nondistended. Pancreas: No ductal  dilatation or mass. Spleen: Normal Adrenals/Urinary Tract: Normal Stomach/Bowel: Mild prox small bowel distention suspicious for an enteritis. No bowel obstruction. Large intestine is unremarkable. Normal appendix. Vascular/Lymphatic: No significant vascular findings are present. No enlarged abdominal or pelvic lymph nodes. Reproductive: Prostate is unremarkable. Other: No abdominal wall hernia or abnormality. No abdominopelvic ascites. Musculoskeletal: No acute or significant osseous findings. IMPRESSION: 1. Mild proximal small bowel distention question enteritis. No bowel obstruction. 2. Nonspecific mild periportal edema which can be seen in hypervolemia, passive hepatic congestion or hepatitis among some considerations. Electronically Signed   By: Tollie Eth M.D.   On: 12/18/2016 23:29    Procedures Procedures (including critical care time)  Medications Ordered in ED Medications  iopamidol (ISOVUE-300) 61 % injection (not administered)  ondansetron (ZOFRAN) injection 4 mg (not administered)  sodium chloride 0.9 % bolus 1,000 mL (1,000 mLs Intravenous New Bag/Given 12/18/16 2146)  pantoprazole (PROTONIX)  injection 40 mg (40 mg Intravenous Given 12/18/16 2147)  ondansetron (ZOFRAN) injection 4 mg (4 mg Intravenous Given 12/18/16 2147)  ketorolac (TORADOL) 30 MG/ML injection 30 mg (30 mg Intravenous Given 12/18/16 2147)  iopamidol (ISOVUE-300) 61 % injection 100 mL (100 mLs Intravenous Contrast Given 12/18/16 2249)     Initial Impression / Assessment and Plan / ED Course  I have reviewed the triage vital signs and the nursing notes.  Pertinent labs & imaging results that were available during my care of the patient were reviewed by me and considered in my medical decision making (see chart for details).     [age] year old male percent to the emergency department for abdominal pain and vomiting. He reports binge drinking last night. Focal tenderness appreciated on physical exam in the right lower  quadrant of the abdomen. Patient with leukocytosis on laboratory workup. CT obtained to evaluate for acute or surgical pathology.  CT findings reviewed which favor mild enteritis. No evidence of bowel obstruction. Patient has been hydrated with fluids and pain has improved following Toradol, Protonix. Nausea improved after antiemetics. The patient has been given ice chips which he has been able to tolerate well. Per RN, patient now drinking Sprite. No subsequent vomiting. Plan to continue with supportive management on an outpatient basis. Patient prescribed Phenergan as well as Bentyl for pain and nausea. Primary care follow-up advised and return precautions given. Patient discharged in stable condition with no unaddressed concerns.   Vitals:   12/18/16 1519 12/18/16 1521 12/18/16 2109 12/18/16 2337  BP: 131/85  125/80 (!) 93/54  Pulse: (!) 52  (!) 40 78  Resp: 16  20 19   Temp: (!) 97.4 F (36.3 C)     TempSrc: Oral     SpO2: 100%  100% 99%  Weight:  68 kg (150 lb)    Height:  5\' 5"  (1.651 m)      Final Clinical Impressions(s) / ED Diagnoses   Final diagnoses:  Enteritis    New Prescriptions New Prescriptions   DICYCLOMINE (BENTYL) 20 MG TABLET    Take 1 tablet (20 mg total) by mouth 2 (two) times daily. Take for abdominal pain/cramping, as needed   PROMETHAZINE (PHENERGAN) 25 MG TABLET    Take 1 tablet (25 mg total) by mouth every 6 (six) hours as needed for nausea or vomiting.     Antony MaduraHumes, Florena Kozma, PA-C 12/19/16 0121    Gerhard MunchLockwood, Robert, MD 12/23/16 1128

## 2016-12-19 MED ORDER — DICYCLOMINE HCL 20 MG PO TABS: 20.0000 mg | ORAL_TABLET | Freq: Two times a day (BID) | ORAL | 0 refills | Status: DC

## 2016-12-19 MED ORDER — ONDANSETRON HCL 4 MG/2ML IJ SOLN
4.0000 mg | Freq: Once | INTRAMUSCULAR | Status: AC
  Administered 2016-12-19: 4 mg via INTRAVENOUS
  Filled 2016-12-19: qty 2

## 2016-12-19 MED ORDER — PROMETHAZINE HCL 25 MG PO TABS: 25.0000 mg | ORAL_TABLET | Freq: Four times a day (QID) | ORAL | 0 refills | Status: DC | PRN

## 2016-12-19 NOTE — Discharge Instructions (Signed)
We recommend a clear liquid/bland diet until symptoms resolve. Take phenergan for nausea/vomiting. You may take tylenol or ibuprofen for pain. Supplement this with Bentyl as needed. Follow-up with a primary care doctor to ensure resolution of symptoms. Avoid drinking alcohol in excess.

## 2017-02-20 ENCOUNTER — Ambulatory Visit (HOSPITAL_COMMUNITY)
Admission: EM | Admit: 2017-02-20 | Discharge: 2017-02-20 | Disposition: A | Discharge status: Home / Self Care | Payer: Self-pay | Attending: Emergency Medicine | Admitting: Emergency Medicine

## 2017-02-20 ENCOUNTER — Encounter (HOSPITAL_COMMUNITY): Payer: Self-pay | Admitting: Emergency Medicine

## 2017-02-20 ENCOUNTER — Other Ambulatory Visit: Payer: Self-pay

## 2017-02-20 DIAGNOSIS — R1033 Periumbilical pain: Secondary | ICD-10-CM

## 2017-02-20 DIAGNOSIS — R112 Nausea with vomiting, unspecified: Secondary | ICD-10-CM

## 2017-02-20 MED ORDER — ONDANSETRON HCL 4 MG PO TABS: 4.0000 mg | ORAL_TABLET | Freq: Four times a day (QID) | ORAL | 0 refills | Status: DC

## 2017-02-20 MED ORDER — ONDANSETRON HCL 4 MG/2ML IJ SOLN
INTRAMUSCULAR | Status: AC
  Filled 2017-02-20: qty 2

## 2017-02-20 MED ORDER — ONDANSETRON HCL 4 MG/2ML IJ SOLN
4.0000 mg | Freq: Once | INTRAMUSCULAR | Status: AC
  Administered 2017-02-20: 4 mg via INTRAMUSCULAR

## 2017-02-20 NOTE — Discharge Instructions (Signed)
Be sure to read the information and instructions. Add Pedialyte to clear liquids only. Drink small amounts frequently. No solid foods for the next 24 hours. Take Zofran to help with nausea and vomiting. If he developed increased pain, fevers, vomiting despite taking the medication or getting worse in other ways directly to the emergency department or call 911.

## 2017-02-20 NOTE — ED Triage Notes (Signed)
Pt c/o vomiting x3 days. Vomitied three times today. Unable to keep anything down.

## 2017-02-20 NOTE — ED Provider Notes (Signed)
[facility]    CSN: [id number] Arrival date & time: 02/20/17  1738     History   Chief Complaint Chief Complaint  Patient presents with  . Emesis    HPI Tim Atkinson is a 31 y.o. male.   31 year old male complaining of vomiting for 3 days. Yesterday he loose stool in the morning but none since. Complaining of periumbilical abdominal pain, initially he had stated it was 10 out of 10 however during my exam he states that it calmed down quite a bit. He has been able to drink fluids in small amounts today without vomiting. His significant other states that yesterday he had a temperature " some were around 100 at home. " Denies upper respiratory symptoms, headache, neck pain, neck stiffness, chest pain, shortness of breath for extremity pain. He points to the umbilical area as the source of pain.      History reviewed. No pertinent past medical history.  There are no active problems to display for this patient.   Past Surgical History:  Procedure Laterality Date  . EYE SURGERY    . TOE SURGERY         Home Medications    Prior to Admission medications   Medication Sig Start Date End Date Taking? Authorizing Provider  dicyclomine (BENTYL) 20 MG tablet Take 1 tablet (20 mg total) by mouth 2 (two) times daily. Take for abdominal pain/cramping, as needed 12/19/16  Yes Antony MaduraHumes, Kelly, PA-C  promethazine (PHENERGAN) 25 MG tablet Take 1 tablet (25 mg total) by mouth every 6 (six) hours as needed for nausea or vomiting. 12/19/16  Yes Antony MaduraHumes, Kelly, PA-C  ibuprofen (ADVIL,MOTRIN) 200 MG tablet Take 200 mg by mouth every 6 (six) hours as needed for moderate pain.    [provider]  ondansetron (ZOFRAN) 4 MG tablet Take 1 tablet (4 mg total) every 6 (six) hours by mouth. 02/20/17   Hayden RasmussenMabe, Aleysia Oltmann, NP    Family History Family History  Problem Relation Age of Onset  . Diverticulitis Mother     Social History Social History   Tobacco Use  . Smoking status:  Current Some Day Smoker    Types: Cigarettes  . Smokeless tobacco: Never Used  Substance Use Topics  . Alcohol use: Yes    Comment: occasionally  . Drug use: No     Allergies   Patient has no known allergies.   Review of Systems Review of Systems  Constitutional: Positive for activity change and appetite change.  HENT: Negative.   Respiratory: Negative.   Gastrointestinal: Positive for abdominal pain, nausea and vomiting. Negative for blood in stool and constipation.  Genitourinary: Negative.      Physical Exam Triage Vital Signs ED Triage Vitals  Enc Vitals Group     BP 02/20/17 1749 138/90     Pulse Rate 02/20/17 1749 (!) 52     Resp 02/20/17 1749 18     Temp 02/20/17 1749 98.8 F (37.1 C)     Temp src --      SpO2 02/20/17 1749 100 %     Weight --      Height --      Head Circumference --      Peak Flow --      Pain Score 02/20/17 1750 10     Pain Loc --      Pain Edu? --      Excl. in GC? --    No data found.  Updated Vital Signs  BP 138/90   Pulse (!) 52   Temp 98.8 F (37.1 C)   Resp 18   SpO2 100%   Visual Acuity Right Eye Distance:   Left Eye Distance:   Bilateral Distance:    Right Eye Near:   Left Eye Near:    Bilateral Near:     Physical Exam  Constitutional: He is oriented to person, place, and time. He appears well-developed and well-nourished. No distress.  Eyes: EOM are normal.  Neck: Neck supple.  Cardiovascular: Normal rate, regular rhythm, normal heart sounds and intact distal pulses.  Pulmonary/Chest: Effort normal and breath sounds normal. No respiratory distress. He has no wheezes.  Abdominal:  Bowel sounds hypoactive. Abdomen is soft. Minor tenderness in the periumbilical area. No other areas of tenderness, rebound or guarding. Percussion tympanic in most areas. No distention.  Musculoskeletal: He exhibits no edema.  Neurological: He is alert and oriented to person, place, and time.  Skin: Skin is warm. He is not  diaphoretic.  Psychiatric: He has a normal mood and affect.  Nursing note and vitals reviewed.    UC Treatments / Results  Labs (all labs ordered are listed, but only abnormal results are displayed) Labs Reviewed - No data to display  EKG  EKG Interpretation None       Radiology No results found.  Procedures Procedures (including critical care time)  Medications Ordered in UC Medications  ondansetron (ZOFRAN) injection 4 mg (4 mg Intramuscular Given 02/20/17 1815)     Initial Impression / Assessment and Plan / UC Course  I have reviewed the triage vital signs and the nursing notes.  Pertinent labs & imaging results that were available during my care of the patient were reviewed by me and considered in my medical decision making (see chart for details).    Be sure to read the information and instructions. Add Pedialyte to clear liquids only. Drink small amounts frequently. No solid foods for the next 24 hours. Take Zofran to help with nausea and vomiting. If he developed increased pain, fevers, vomiting despite taking the medication or getting worse in other ways directly to the emergency department or call 911.    Final Clinical Impressions(s) / UC Diagnoses   Final diagnoses:  Non-intractable vomiting with nausea, unspecified vomiting type  Periumbilical abdominal pain    ED Discharge Orders        Ordered    ondansetron (ZOFRAN) 4 MG tablet  Every 6 hours     02/20/17 1815       Controlled Substance Prescriptions [place] Controlled Substance Registry consulted? Not Applicable   Hayden RasmussenMabe, Ruth Tully, NP 02/20/17 [id number]

## 2018-03-10 ENCOUNTER — Other Ambulatory Visit: Payer: Self-pay | Admitting: Occupational Medicine

## 2018-03-10 ENCOUNTER — Ambulatory Visit: Payer: Self-pay

## 2018-03-10 DIAGNOSIS — M79644 Pain in right finger(s): Secondary | ICD-10-CM

## 2019-04-27 IMAGING — DX DG FINGER THUMB 2+V*R*
3 series · 3 of 3 positions shown · non-contrast
Comparison: None.

CLINICAL DATA: Crush injury to right thumb

EXAM:
RIGHT THUMB 2+V

[finger pa]
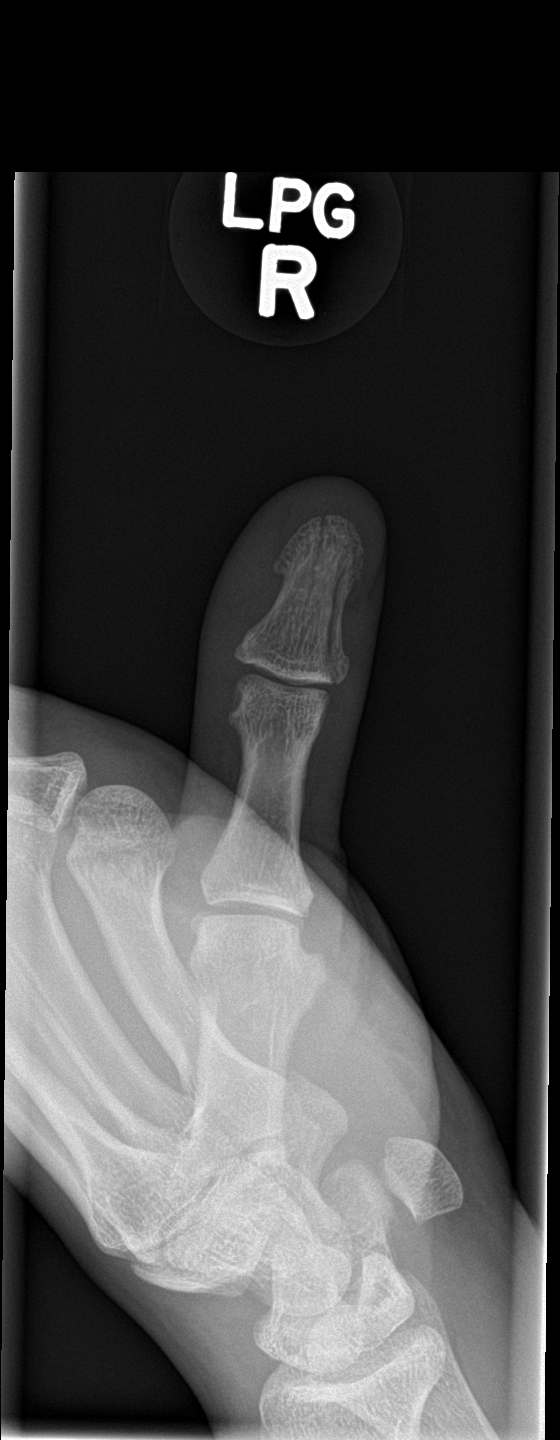

[finger obl]
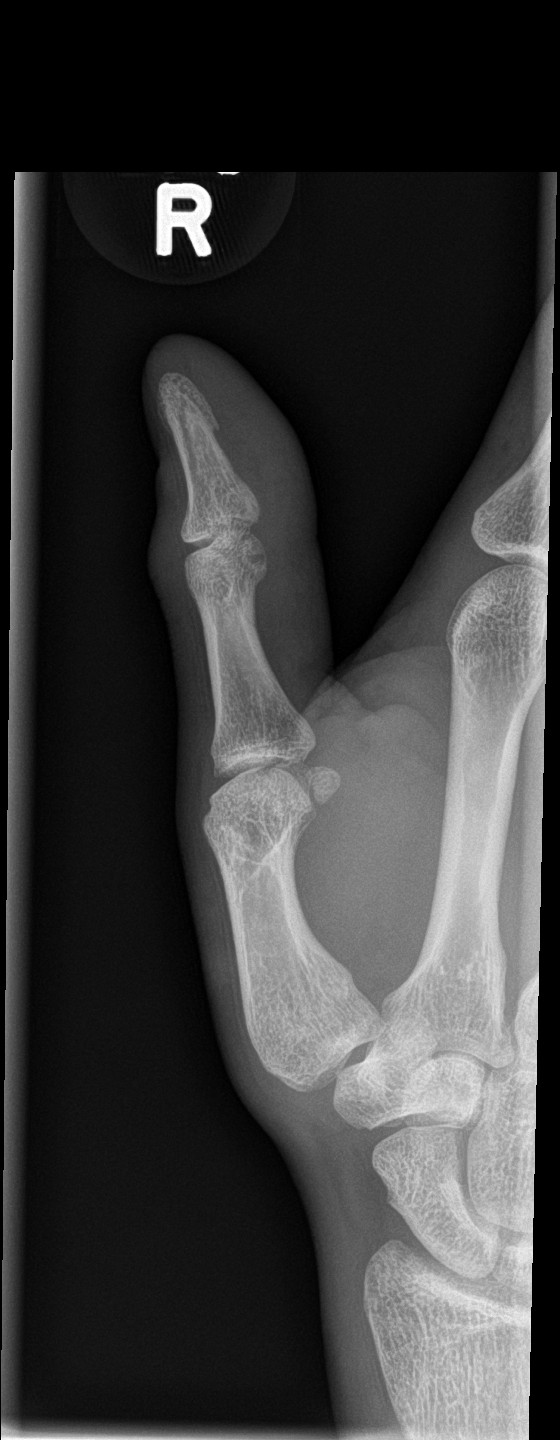

[finger lat]
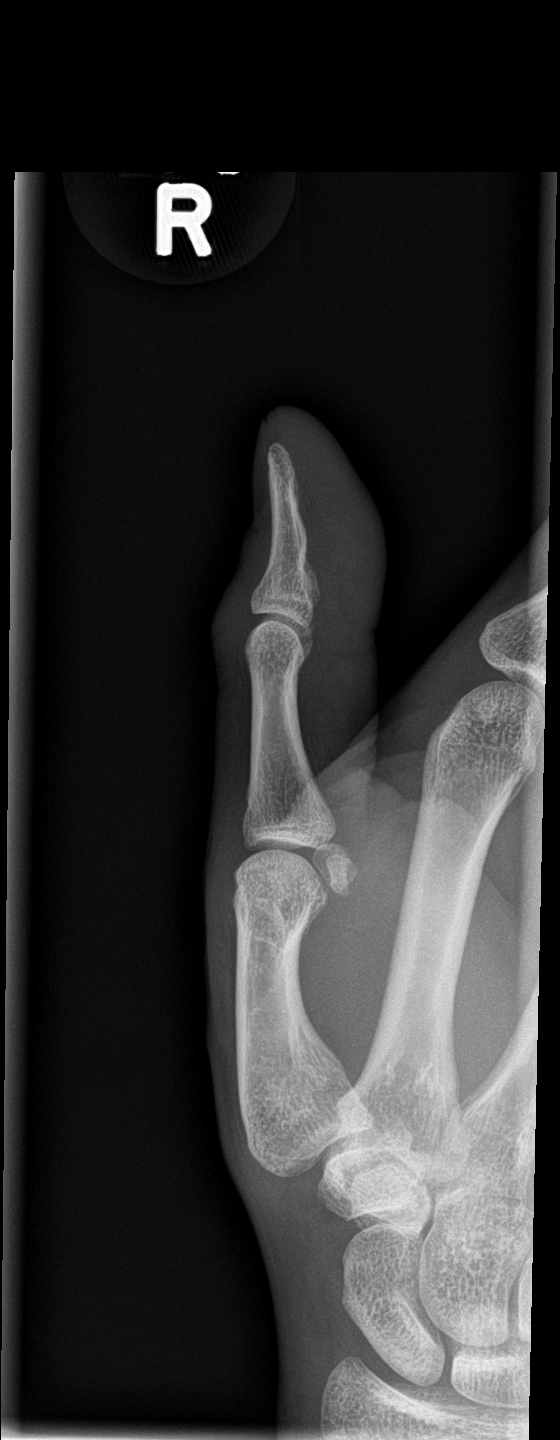

[3 of 3 positions shown; findings below may reference images not displayed]

FINDINGS: Two longitudinal nondisplaced fractures through the distal phalanx,
extending from base to tuft. No dislocation. No opaque foreign body
IMPRESSION: Nondisplaced distal phalanx fracture of the thumb.

## 2020-07-01 ENCOUNTER — Emergency Department (HOSPITAL_COMMUNITY)
Admission: EM | Admit: 2020-07-01 | Discharge: 2020-07-01 | Disposition: A | Discharge status: Home / Self Care | Attending: Emergency Medicine | Admitting: Emergency Medicine

## 2020-07-01 ENCOUNTER — Other Ambulatory Visit: Payer: Self-pay

## 2020-07-01 DIAGNOSIS — H6123 Impacted cerumen, bilateral: Secondary | ICD-10-CM | POA: Diagnosis not present

## 2020-07-01 DIAGNOSIS — H9201 Otalgia, right ear: Secondary | ICD-10-CM | POA: Diagnosis present

## 2020-07-01 DIAGNOSIS — F17210 Nicotine dependence, cigarettes, uncomplicated: Secondary | ICD-10-CM | POA: Insufficient documentation

## 2020-07-01 DIAGNOSIS — H00015 Hordeolum externum left lower eyelid: Secondary | ICD-10-CM | POA: Diagnosis not present

## 2020-07-01 MED ORDER — CARBAMIDE PEROXIDE 6.5 % OT SOLN: 5.0000 [drp] | Freq: Two times a day (BID) | OTIC | 0 refills | Status: AC

## 2020-07-01 MED ORDER — ERYTHROMYCIN 5 MG/GM OP OINT: TOPICAL_OINTMENT | OPHTHALMIC | 0 refills | Status: DC

## 2020-07-01 NOTE — ED Provider Notes (Signed)
[facility] EMERGENCY DEPARTMENT Provider Note   CSN: [id number] Arrival date & time: 07/01/20  0920     History Chief Complaint  Patient presents with  . Otalgia    Tim Atkinson is a 35 y.o. male.  HPI   35 year old male presenting emergency department today for evaluation of multiple complaints.  States that he has had a popping sensation and some pain to the right ear for several days.  There have been no fevers or other upper respiratory symptoms.  He also reports he had had swollen areas to the lower eyelid for the last 2 months that he thinks is a stye.  He has been using some over-the-counter creams without resolution of symptoms.  There have been no fevers and no reported visual changes.  No past medical history on file.  There are no problems to display for this patient.   Past Surgical History:  Procedure Laterality Date  . EYE SURGERY    . TOE SURGERY         Family History  Problem Relation Age of Onset  . Diverticulitis Mother     Social History   Tobacco Use  . Smoking status: Current Some Day Smoker    Types: Cigarettes  . Smokeless tobacco: Never Used  Vaping Use  . Vaping Use: Never used  Substance Use Topics  . Alcohol use: Yes    Comment: occasionally  . Drug use: No    Home Medications Prior to Admission medications   Medication Sig Start Date End Date Taking? Authorizing Provider  carbamide peroxide (DEBROX) 6.5 % OTIC solution Place 5 drops into both ears 2 (two) times daily for 3 days. 07/01/20 07/04/20 Yes Couture, Cortni S, PA-C  dicyclomine (BENTYL) 20 MG tablet Take 1 tablet (20 mg total) by mouth 2 (two) times daily. Take for abdominal pain/cramping, as needed 12/19/16   Antony Madura, PA-C  erythromycin ophthalmic ointment Place a 1/2 inch ribbon of ointment into the lower eyelid for 7 days 07/01/20  Yes Couture, Cortni S, PA-C  ibuprofen (ADVIL,MOTRIN) 200 MG tablet Take 200 mg by mouth every 6 (six) hours as  needed for moderate pain.    [provider]  ondansetron (ZOFRAN) 4 MG tablet Take 1 tablet (4 mg total) every 6 (six) hours by mouth. 02/20/17   Hayden Rasmussen, NP  promethazine (PHENERGAN) 25 MG tablet Take 1 tablet (25 mg total) by mouth every 6 (six) hours as needed for nausea or vomiting. 12/19/16   Antony Madura, PA-C    Allergies    Patient has no known allergies.  Review of Systems   Review of Systems  Constitutional: Negative for fever.  HENT: Positive for ear pain.   Eyes: Negative for photophobia, pain, redness and visual disturbance.       Stye    Physical Exam Updated Vital Signs BP 124/88   Pulse 77   Temp 98.3 F (36.8 C)   Resp 16   Ht 5\' 6"  (1.676 m)   Wt 70.3 kg   SpO2 97%   BMI 25.02 kg/m   Physical Exam Constitutional:      General: He is not in acute distress.    Appearance: He is well-developed.  HENT:     Right Ear: There is impacted cerumen.     Left Ear: There is impacted cerumen.  Eyes:     Extraocular Movements: Extraocular movements intact.     Conjunctiva/sclera: Conjunctivae normal.     Pupils: Pupils are  equal, round, and reactive to light.     Comments: Skin colored papule noted to left lower eyelid  Cardiovascular:     Rate and Rhythm: Normal rate and regular rhythm.  Pulmonary:     Effort: Pulmonary effort is normal.     Breath sounds: Normal breath sounds.  Skin:    General: Skin is warm and dry.  Neurological:     Mental Status: He is alert and oriented to person, place, and time.     ED Results / Procedures / Treatments   Labs (all labs ordered are listed, but only abnormal results are displayed) Labs Reviewed - No data to display  EKG None  Radiology No results found.  Procedures .Ear Cerumen Removal  Date/Time: 07/01/2020 10:50 AM Performed by: Karrie Meres, PA-C Authorized by: Karrie Meres, PA-C   Consent:    Consent obtained:  Verbal   Consent given by:  Patient   Risks, benefits, and  alternatives were discussed: yes     Risks discussed:  Bleeding, incomplete removal and infection   Alternatives discussed:  No treatment and alternative treatment Universal protocol:    Patient identity confirmed:  Verbally with patient Procedure details:    Location:  L ear and R ear   Procedure type: curette     Procedure outcomes: cerumen removed   Post-procedure details:    Post-procedure ear inspection: scant bleeding with some cerumen remaining.   Hearing quality:  Improved   Procedure completion:  Tolerated     Medications Ordered in ED Medications - No data to display  ED Course  I have reviewed the triage vital signs and the nursing notes.  Pertinent labs & imaging results that were available during my care of the patient were reviewed by me and considered in my medical decision making (see chart for details).    MDM Rules/Calculators/A&P                          Patient here with ear discomfort.  On exam has bilateral cerumen impactions.  Cerumen removal was performed in the ED and patient tolerated well.  Rx given for Debrox.  He is also complaining of a stye.  He has had this for about 2 months.  We will trial erythromycin ointment however I advised that if this does not improve he needs to follow-up with PCP or ophthalmology for reassessment.  No associated visual or ophthalmologic complaints.  Discharged in stable condition advised to return if worse.  Final Clinical Impression(s) / ED Diagnoses Final diagnoses:  Bilateral impacted cerumen  Hordeolum externum of left lower eyelid    Rx / DC Orders ED Discharge Orders         Ordered    carbamide peroxide (DEBROX) 6.5 % OTIC solution  2 times daily        07/01/20 1041    erythromycin ophthalmic ointment        07/01/20 [address], Cortni S, PA-C 07/01/20 1052    Jacalyn Lefevre, MD 07/01/20 1331

## 2020-07-01 NOTE — ED Triage Notes (Signed)
Pt has been having ear pain. Pt states " it feels like water in his ears." left eye has stye.has been using onitment for 5 days, and did not work.

## 2020-07-01 NOTE — Discharge Instructions (Addendum)
Use the antibiotic ointment on your eye for the next week. If this does not improve you will need to either follow up with the primary care provider or ophthalmologist that was provided in your discharge paperwork.   Additionally you were given some drops to help soften the wax in your ears.  Please use as directed.  Do not use Q-tips.  Monitor your symptoms and return to the emergency department if having any new or worsening complaints

## 2020-07-04 ENCOUNTER — Encounter (HOSPITAL_COMMUNITY): Payer: Self-pay

## 2020-07-04 ENCOUNTER — Other Ambulatory Visit: Payer: Self-pay

## 2020-07-04 ENCOUNTER — Emergency Department (HOSPITAL_COMMUNITY)
Admission: EM | Admit: 2020-07-04 | Discharge: 2020-07-04 | Disposition: A | Discharge status: Home / Self Care | Attending: Emergency Medicine | Admitting: Emergency Medicine

## 2020-07-04 DIAGNOSIS — H9190 Unspecified hearing loss, unspecified ear: Secondary | ICD-10-CM | POA: Diagnosis present

## 2020-07-04 DIAGNOSIS — H65192 Other acute nonsuppurative otitis media, left ear: Secondary | ICD-10-CM | POA: Diagnosis not present

## 2020-07-04 DIAGNOSIS — F17210 Nicotine dependence, cigarettes, uncomplicated: Secondary | ICD-10-CM | POA: Diagnosis not present

## 2020-07-04 DIAGNOSIS — H6121 Impacted cerumen, right ear: Secondary | ICD-10-CM | POA: Diagnosis not present

## 2020-07-04 MED ORDER — FLUTICASONE PROPIONATE 50 MCG/ACT NA SUSP: 1.0000 | Freq: Every day | NASAL | 2 refills | Status: DC

## 2020-07-04 MED ORDER — CETIRIZINE-PSEUDOEPHEDRINE ER 5-120 MG PO TB12: 1.0000 | ORAL_TABLET | Freq: Every day | ORAL | 0 refills | Status: DC

## 2020-07-04 NOTE — Discharge Instructions (Addendum)
Take Zyrtec-D daily as prescribed, take Flonase daily. Follow-up with ENT if symptoms do not improve.

## 2020-07-04 NOTE — ED Provider Notes (Signed)
[facility] EMERGENCY DEPARTMENT Provider Note   CSN: [id number] Arrival date & time: 07/04/20  3785     History No chief complaint on file.   Bright ORENTHAL DEBSKI is a 35 y.o. male.  35 year old male with ongoing decreased hearing/fullness, using wax softening drops as directed at ER visit 2 days ago. Denies drainage from the ears or pain. Reports unable to hear out of left ear.         History reviewed. No pertinent past medical history.  There are no problems to display for this patient.   Past Surgical History:  Procedure Laterality Date  . EYE SURGERY    . TOE SURGERY         Family History  Problem Relation Age of Onset  . Diverticulitis Mother     Social History   Tobacco Use  . Smoking status: Current Some Day Smoker    Types: Cigarettes  . Smokeless tobacco: Never Used  Vaping Use  . Vaping Use: Never used  Substance Use Topics  . Alcohol use: Yes    Comment: occasionally  . Drug use: No    Home Medications Prior to Admission medications   Medication Sig Start Date End Date Taking? Authorizing Provider  cetirizine-pseudoephedrine (ZYRTEC-D) 5-120 MG tablet Take 1 tablet by mouth daily. 07/04/20  Yes Jeannie Fend, PA-C  fluticasone (FLONASE) 50 MCG/ACT nasal spray Place 1 spray into both nostrils daily. 07/04/20  Yes Jeannie Fend, PA-C  carbamide peroxide (DEBROX) 6.5 % OTIC solution Place 5 drops into both ears 2 (two) times daily for 3 days. 07/01/20 07/04/20  Couture, Cortni S, PA-C  dicyclomine (BENTYL) 20 MG tablet Take 1 tablet (20 mg total) by mouth 2 (two) times daily. Take for abdominal pain/cramping, as needed 12/19/16   Antony Madura, PA-C  erythromycin ophthalmic ointment Place a 1/2 inch ribbon of ointment into the lower eyelid for 7 days 07/01/20   Couture, Cortni S, PA-C  ibuprofen (ADVIL,MOTRIN) 200 MG tablet Take 200 mg by mouth every 6 (six) hours as needed for moderate pain.    [provider]  ondansetron  (ZOFRAN) 4 MG tablet Take 1 tablet (4 mg total) every 6 (six) hours by mouth. 02/20/17   Hayden Rasmussen, NP  promethazine (PHENERGAN) 25 MG tablet Take 1 tablet (25 mg total) by mouth every 6 (six) hours as needed for nausea or vomiting. 12/19/16   Antony Madura, PA-C    Allergies    Patient has no known allergies.  Review of Systems   Review of Systems  Constitutional: Negative for fever.  HENT: Negative for congestion, dental problem, ear discharge, ear pain, facial swelling and sore throat.   Respiratory: Negative for cough.   Skin: Negative for rash and wound.  Allergic/Immunologic: Negative for immunocompromised state.  Neurological: Negative for headaches.  Hematological: Negative for adenopathy.  All other systems reviewed and are negative.   Physical Exam Updated Vital Signs BP 122/70 (BP Location: Right Arm)   Pulse 74   Temp 97.9 F (36.6 C) (Oral)   Resp 14   SpO2 100%   Physical Exam Vitals and nursing note reviewed.  Constitutional:      General: He is not in acute distress.    Appearance: He is well-developed. He is not diaphoretic.  HENT:     Head: Normocephalic and atraumatic.     Right Ear: There is impacted cerumen. No mastoid tenderness.     Left Ear: External ear normal. No  drainage, swelling or tenderness. A middle ear effusion is present. There is no impacted cerumen. No mastoid tenderness. Tympanic membrane is not perforated, erythematous, retracted or bulging.     Nose: Nose normal.     Mouth/Throat:     Mouth: Mucous membranes are moist.     Pharynx: No oropharyngeal exudate or posterior oropharyngeal erythema.  Eyes:     Conjunctiva/sclera: Conjunctivae normal.  Pulmonary:     Effort: Pulmonary effort is normal.  Musculoskeletal:     Cervical back: Neck supple.  Lymphadenopathy:     Cervical: No cervical adenopathy.  Skin:    General: Skin is warm and dry.     Findings: No erythema or rash.  Neurological:     Mental Status: He is alert and  oriented to person, place, and time.  Psychiatric:        Behavior: Behavior normal.     ED Results / Procedures / Treatments   Labs (all labs ordered are listed, but only abnormal results are displayed) Labs Reviewed - No data to display  EKG None  Radiology No results found.  Procedures .Ear Cerumen Removal  Date/Time: 07/04/2020 10:13 AM Performed by: Jeannie Fend, PA-C Authorized by: Jeannie Fend, PA-C   Consent:    Consent obtained:  Verbal   Consent given by:  Patient   Risks, benefits, and alternatives were discussed: yes     Risks discussed:  Bleeding, dizziness, infection, incomplete removal, pain and TM perforation   Alternatives discussed:  No treatment Universal protocol:    Patient identity confirmed:  Verbally with patient Procedure details:    Location:  R ear   Procedure type: irrigation       Medications Ordered in ED Medications - No data to display  ED Course  I have reviewed the triage vital signs and the nursing notes.  Pertinent labs & imaging results that were available during my care of the patient were reviewed by me and considered in my medical decision making (see chart for details).  Clinical Course as of 07/04/20 1048  Tue Jul 04, 2020  860 35 year old male return to the ED with loss of hearing out of his left ear with sensation of ongoing fullness to both ears.  Patient seen in the ED 2 days ago, had ear irrigation at that time.  On exam, left ear now has a small amount of residual wax, TM visualized and is intact with effusion.  Right ear canal with cerumen impaction which was irrigated by RN, visualized after cerumen removal, TM intact. Patient is given Zyrtec-D and Flonase to take for eustachian tube dysfunction and referred to ENT for follow-up if symptoms persist. [LM]    Clinical Course User Index [LM] Alden Hipp   MDM Rules/Calculators/A&P                          Final Clinical Impression(s) / ED  Diagnoses Final diagnoses:  Impacted cerumen of right ear  Acute effusion of left ear    Rx / DC Orders ED Discharge Orders         Ordered    fluticasone (FLONASE) 50 MCG/ACT nasal spray  Daily        07/04/20 1017    cetirizine-pseudoephedrine (ZYRTEC-D) 5-120 MG tablet  Daily        07/04/20 1017           Alden Hipp 07/04/20 1048    [place],  Lorin Picket, MD 07/05/20 1515

## 2020-07-04 NOTE — ED Notes (Signed)
Pt's right ear irrigated with 100cc warm normal saline.  Ear was irrigated with a syringe until no more cerumen was present when flushing.  Ear canal was then observed to be clear   Provider notified of these results.

## 2020-07-04 NOTE — ED Triage Notes (Signed)
Patient reports that he has ongoing problem with bilateral ears being stopped up. Complains of pain to same

## 2020-08-21 ENCOUNTER — Inpatient Hospital Stay: Payer: Self-pay | Admitting: Internal Medicine

## 2021-03-18 ENCOUNTER — Encounter (HOSPITAL_COMMUNITY): Payer: Self-pay | Admitting: Emergency Medicine

## 2021-03-18 ENCOUNTER — Other Ambulatory Visit: Payer: Self-pay

## 2021-03-18 ENCOUNTER — Emergency Department (HOSPITAL_COMMUNITY)
Admission: EM | Admit: 2021-03-18 | Discharge: 2021-03-18 | Disposition: A | Discharge status: Home / Self Care | Payer: Self-pay | Attending: Emergency Medicine | Admitting: Emergency Medicine

## 2021-03-18 DIAGNOSIS — R112 Nausea with vomiting, unspecified: Secondary | ICD-10-CM | POA: Insufficient documentation

## 2021-03-18 DIAGNOSIS — R197 Diarrhea, unspecified: Secondary | ICD-10-CM | POA: Insufficient documentation

## 2021-03-18 DIAGNOSIS — F17210 Nicotine dependence, cigarettes, uncomplicated: Secondary | ICD-10-CM | POA: Insufficient documentation

## 2021-03-18 DIAGNOSIS — R1084 Generalized abdominal pain: Secondary | ICD-10-CM | POA: Insufficient documentation

## 2021-03-18 LAB — COMPREHENSIVE METABOLIC PANEL
ALT: 22 U/L (ref 0–44)
AST: 21 U/L (ref 15–41)
Albumin: 4.6 g/dL (ref 3.5–5.0)
Alkaline Phosphatase: 57 U/L (ref 38–126)
Anion gap: 10 (ref 5–15)
BUN: 11 mg/dL (ref 6–20)
CO2: 27 mmol/L (ref 22–32)
Calcium: 10 mg/dL (ref 8.9–10.3)
Chloride: 99 mmol/L (ref 98–111)
Creatinine, Ser: 1.07 mg/dL (ref 0.61–1.24)
GFR, Estimated: >60 mL/min (ref >60)
Glucose, Bld: 120 mg/dL — ABNORMAL HIGH (ref 70–99)
Potassium: 3.8 mmol/L (ref 3.5–5.1)
Sodium: 136 mmol/L (ref 135–145)
Total Bilirubin: 0.8 mg/dL (ref 0.3–1.2)
Total Protein: 7.7 g/dL (ref 6.5–8.1)

## 2021-03-18 LAB — CBC WITH DIFFERENTIAL/PLATELET
Abs Immature Granulocytes: 0.07 10*3/uL (ref 0.00–0.07)
Basophils Absolute: 0 10*3/uL (ref 0.0–0.1)
Basophils Relative: 0 %
Eosinophils Absolute: 0 10*3/uL (ref 0.0–0.5)
Eosinophils Relative: 0 %
HCT: 49 % (ref 39.0–52.0)
Hemoglobin: 17.2 g/dL — ABNORMAL HIGH (ref 13.0–17.0)
Immature Granulocytes: 1 %
Lymphocytes Relative: 10 %
Lymphs Abs: 1.4 10*3/uL (ref 0.7–4.0)
MCH: 28.7 pg (ref 26.0–34.0)
MCHC: 35.1 g/dL (ref 30.0–36.0)
MCV: 81.7 fL (ref 80.0–100.0)
Monocytes Absolute: 0.9 10*3/uL (ref 0.1–1.0)
Monocytes Relative: 6 %
Neutro Abs: 11.7 10*3/uL — ABNORMAL HIGH (ref 1.7–7.7)
Neutrophils Relative %: 83 %
Platelets: 342 10*3/uL (ref 150–400)
RBC: 6 MIL/uL — ABNORMAL HIGH (ref 4.22–5.81)
RDW: 12.9 % (ref 11.5–15.5)
WBC: 14.1 10*3/uL — ABNORMAL HIGH (ref 4.0–10.5)
nRBC: 0 % (ref 0.0–0.2)

## 2021-03-18 LAB — LIPASE, BLOOD: Lipase: 25 U/L (ref 11–51)

## 2021-03-18 MED ORDER — ONDANSETRON HCL 4 MG/2ML IJ SOLN
4.0000 mg | Freq: Once | INTRAMUSCULAR | Status: AC
  Administered 2021-03-18: 4 mg via INTRAVENOUS
  Filled 2021-03-18: qty 2

## 2021-03-18 MED ORDER — ONDANSETRON 4 MG PO TBDP: 4.0000 mg | ORAL_TABLET | Freq: Three times a day (TID) | ORAL | 0 refills | Status: DC | PRN

## 2021-03-18 MED ORDER — ONDANSETRON 4 MG PO TBDP
4.0000 mg | ORAL_TABLET | Freq: Once | ORAL | Status: AC
  Administered 2021-03-18: 4 mg via ORAL
  Filled 2021-03-18: qty 1

## 2021-03-18 MED ORDER — MORPHINE SULFATE (PF) 4 MG/ML IV SOLN
4.0000 mg | Freq: Once | INTRAVENOUS | Status: AC
  Administered 2021-03-18: 4 mg via INTRAVENOUS
  Filled 2021-03-18: qty 1

## 2021-03-18 MED ORDER — ACETAMINOPHEN 500 MG PO TABS
1000.0000 mg | ORAL_TABLET | Freq: Once | ORAL | Status: AC
  Administered 2021-03-18: 1000 mg via ORAL
  Filled 2021-03-18: qty 2

## 2021-03-18 MED ORDER — SODIUM CHLORIDE 0.9 % IV BOLUS
1000.0000 mL | Freq: Once | INTRAVENOUS | Status: AC
  Administered 2021-03-18: 1000 mL via INTRAVENOUS

## 2021-03-18 MED ORDER — ONDANSETRON HCL 4 MG PO TABS: 4.0000 mg | ORAL_TABLET | Freq: Four times a day (QID) | ORAL | 0 refills | Status: DC

## 2021-03-18 NOTE — ED Triage Notes (Signed)
Reports generalized abd pain with nausea, vomiting, and diarrhea since Friday.

## 2021-03-18 NOTE — ED Provider Notes (Signed)
  Physical Exam  BP 129/88   Pulse (!) 47   Temp 98.6 F (37 C) (Oral)   Resp 18   Ht 5\' 6"  (1.676 m)   Wt 70.3 kg   SpO2 99%   BMI 25.02 kg/m   Physical Exam Vitals and nursing note reviewed.  Constitutional:      General: He is not in acute distress.    Appearance: Normal appearance. He is well-developed.  HENT:     Head: Normocephalic and atraumatic.     Right Ear: External ear normal.     Left Ear: External ear normal.     Nose: Nose normal. No congestion.     Mouth/Throat:     Mouth: Mucous membranes are moist.     Pharynx: Oropharynx is clear. No posterior oropharyngeal erythema.  Eyes:     Extraocular Movements: Extraocular movements intact.     Conjunctiva/sclera: Conjunctivae normal.     Pupils: Pupils are equal, round, and reactive to light.  Cardiovascular:     Rate and Rhythm: Normal rate and regular rhythm.     Pulses: Normal pulses.     Heart sounds: No murmur heard. Pulmonary:     Effort: Pulmonary effort is normal. No respiratory distress.     Breath sounds: Normal breath sounds. No wheezing, rhonchi or rales.  Abdominal:     General: Abdomen is flat. Bowel sounds are normal.     Palpations: Abdomen is soft.     Tenderness: There is no abdominal tenderness. There is no guarding or rebound.  Musculoskeletal:        General: No swelling, tenderness or deformity. Normal range of motion.     Cervical back: Normal range of motion and neck supple. No rigidity.  Skin:    General: Skin is warm and dry.     Capillary Refill: Capillary refill takes less than 2 seconds.     Findings: No rash.  Neurological:     General: No focal deficit present.     Mental Status: He is alert and oriented to person, place, and time.  Psychiatric:        Mood and Affect: Mood normal.    ED Course/Procedures     Procedures  MDM   35 year old male presenting with nausea, vomiting, diarrhea, abdominal pain.  Afebrile here.  Nontoxic-appearing.  Abdominal exam showed with  diffuse tenderness but no rebound or guarding.  He was given IV fluids, zofran for symptom control.  Basic labs obtained.  Reassuring electrolytes, renal function, LFTs.  Patient reassessed.  Tolerating p.o.  Repeat abdominal exam benign.  Presentation most consistent with viral process.  Appropriate for discharge home with close follow-up with PCP for reassessment.   34, MD 03/18/21 14/11/22    Babette Relic, MD 03/20/21 0010

## 2021-03-18 NOTE — ED Provider Notes (Signed)
Emergency Medicine Provider Triage Evaluation Note  Tim Atkinson , a 35 y.o. male  was evaluated in triage.  Pt complains of abdominal pain.  He states that on Friday he accidentally ate pork.  He states that he does not eat pork.  He states that after that he began having abdominal pain, nausea and vomiting.  Describes abdominal pain as cramping.  He states that he vomited multiple times on Friday as well as had a few episodes of diarrhea.  He states that he then continued to have vomiting throughout the weekend.  He has had decreased p.o. intake.  Denies having fevers.  Denies urinary symptoms  Review of Systems  Positive: See above Negative:   Physical Exam  BP 139/87 (BP Location: Right Arm)   Pulse (!) 50   Temp 98.6 F (37 C) (Oral)   Resp 18   SpO2 100%  Gen:   Awake, no distress   Resp:  Normal effort  MSK:   Moves extremities without difficulty  Other:  Bowel sounds present.  Abdomen is soft.  Mild, diffuse abdominal tenderness to palpation  Medical Decision Making  Medically screening exam initiated at 12:15 PM.  Appropriate orders placed.  Thomson Gardner Candle was informed that the remainder of the evaluation will be completed by another provider, this initial triage assessment does not replace that evaluation, and the importance of remaining in the ED until their evaluation is complete.     Cristopher Peru, PA-C 03/18/21 1217    Pollyann Savoy, MD 03/18/21 1356

## 2021-03-18 NOTE — ED Provider Notes (Signed)
[facility] EMERGENCY DEPARTMENT Provider Note   CSN: [id number] Arrival date & time: 03/18/21  1052     History Chief Complaint  Patient presents with   Abdominal Pain    Tim Atkinson is a 35 y.o. male with no significant past medical history who presents for evaluation of nausea, vomiting and diarrhea.  Some associated generalized abdominal cramping.  States this began after he ate pork on Friday.  Does not typically eat pork.  Multiple episodes of NBNB emesis.  Has had decreased p.o. intake.  No melena or blood per rectum.  No fever.  No urinary symptoms.  No focal abdominal pain.  Pain began after he started vomiting.  No EtOH use, with substance use, marijuana.  Other people ate the same food and they did not have similar symptoms.  Denies additional aggravating or relieving factors.  No recent travel, antibiotics.  Cannot quantify how many episodes of loose stools he is having  History obtained from patient and past medical records.  No interpreter used.  HPI     History reviewed. No pertinent past medical history.  There are no problems to display for this patient.   Past Surgical History:  Procedure Laterality Date   EYE SURGERY     TOE SURGERY         Family History  Problem Relation Age of Onset   Diverticulitis Mother     Social History   Tobacco Use   Smoking status: Some Days    Types: Cigarettes   Smokeless tobacco: Never  Vaping Use   Vaping Use: Never used  Substance Use Topics   Alcohol use: Yes    Comment: occasionally   Drug use: No    Home Medications Prior to Admission medications   Medication Sig Start Date End Date Taking? Authorizing Provider  ondansetron (ZOFRAN-ODT) 4 MG disintegrating tablet Take 1 tablet (4 mg total) by mouth every 8 (eight) hours as needed for nausea or vomiting. 03/18/21  Yes Jenisis Harmsen A, PA-C  cetirizine-pseudoephedrine (ZYRTEC-D) 5-120 MG tablet Take 1 tablet by mouth  daily. Patient not taking: Reported on 03/18/2021 07/04/20   Army Melia A, PA-C  dicyclomine (BENTYL) 20 MG tablet Take 1 tablet (20 mg total) by mouth 2 (two) times daily. Take for abdominal pain/cramping, as needed Patient not taking: Reported on 03/18/2021 12/19/16   Antony Madura, PA-C  erythromycin ophthalmic ointment Place a 1/2 inch ribbon of ointment into the lower eyelid for 7 days Patient not taking: Reported on 03/18/2021 07/01/20   Couture, Cortni S, PA-C  fluticasone (FLONASE) 50 MCG/ACT nasal spray Place 1 spray into both nostrils daily. Patient not taking: Reported on 03/18/2021 07/04/20   Army Melia A, PA-C  ibuprofen (ADVIL,MOTRIN) 200 MG tablet Take 200 mg by mouth every 6 (six) hours as needed for moderate pain. Patient not taking: Reported on 03/18/2021    [provider]  ondansetron (ZOFRAN) 4 MG tablet Take 1 tablet (4 mg total) every 6 (six) hours by mouth. Patient not taking: Reported on 03/18/2021 02/20/17   Hayden Rasmussen, NP  promethazine (PHENERGAN) 25 MG tablet Take 1 tablet (25 mg total) by mouth every 6 (six) hours as needed for nausea or vomiting. Patient not taking: Reported on 03/18/2021 12/19/16   Antony Madura, PA-C    Allergies    Patient has no known allergies.  Review of Systems   Review of Systems  Constitutional: Negative.   HENT: Negative.    Respiratory: Negative.  Cardiovascular: Negative.   Gastrointestinal:  Positive for abdominal pain, diarrhea, nausea and vomiting. Negative for abdominal distention, anal bleeding, blood in stool, constipation and rectal pain.  Genitourinary: Negative.   Musculoskeletal: Negative.   Skin: Negative.   Neurological: Negative.   All other systems reviewed and are negative.  Physical Exam Updated Vital Signs BP 129/88   Pulse (!) 47   Temp 98.6 F (37 C) (Oral)   Resp 18   Ht 5\' 6"  (1.676 m)   Wt 70.3 kg   SpO2 99%   BMI 25.02 kg/m   Physical Exam Vitals and nursing note reviewed.   Constitutional:      General: He is not in acute distress.    Appearance: He is well-developed. He is not ill-appearing or diaphoretic.     Comments: Active emesis in room  HENT:     Head: Atraumatic.  Eyes:     Pupils: Pupils are equal, round, and reactive to light.  Cardiovascular:     Rate and Rhythm: Normal rate and regular rhythm.     Heart sounds: Normal heart sounds.  Pulmonary:     Effort: Pulmonary effort is normal. No respiratory distress.     Breath sounds: Normal breath sounds.  Abdominal:     General: Bowel sounds are normal. There is no distension.     Palpations: Abdomen is soft.     Tenderness: There is generalized abdominal tenderness. There is no right CVA tenderness, left CVA tenderness, guarding or rebound. Negative signs include Murphy's sign and McBurney's sign.  Musculoskeletal:        General: Normal range of motion.     Cervical back: Normal range of motion and neck supple.  Skin:    General: Skin is warm and dry.     Capillary Refill: Capillary refill takes less than 2 seconds.  Neurological:     General: No focal deficit present.     Mental Status: He is alert and oriented to person, place, and time.    ED Results / Procedures / Treatments   Labs (all labs ordered are listed, but only abnormal results are displayed) Labs Reviewed  COMPREHENSIVE METABOLIC PANEL - Abnormal; Notable for the following components:      Result Value   Glucose, Bld 120 (*)    All other components within normal limits  CBC WITH DIFFERENTIAL/PLATELET - Abnormal; Notable for the following components:   WBC 14.1 (*)    RBC 6.00 (*)    Hemoglobin 17.2 (*)    Neutro Abs 11.7 (*)    All other components within normal limits  LIPASE, BLOOD  URINALYSIS, ROUTINE W REFLEX MICROSCOPIC  RAPID URINE DRUG SCREEN, HOSP PERFORMED    EKG None  Radiology No results found.  Procedures Procedures   Medications Ordered in ED Medications  ondansetron (ZOFRAN) injection 4 mg  (4 mg Intravenous Given 03/18/21 1357)  sodium chloride 0.9 % bolus 1,000 mL (1,000 mLs Intravenous New Bag/Given 03/18/21 1357)  morphine 4 MG/ML injection 4 mg (4 mg Intravenous Given 03/18/21 1403)   ED Course  I have reviewed the triage vital signs and the nursing notes.  Pertinent labs & imaging results that were available during my care of the patient were reviewed by me and considered in my medical decision making (see chart for details).  35 year old here for evaluation of nausea, vomiting and generalized abdominal pain.  He is afebrile, nonseptic, not ill-appearing.  Has benign abdominal exam with just generalized tenderness however no focal pain.  He is actively vomiting in the room.  Will give fluids, antiemetics  Work-up started from triage today personally reviewed and interpreted:  CBC leukocytosis at 14.1, hemoglobin 17.2 suspect hemoconcentration due to dehydration Lipase 25 Metabolic panel glucose 120  Patient reassessed.  Feeling improved.  No further emesis since Zofran.  Repeat benign abdominal exam without any focal pain, rebound or guarding.  Requesting something to drink.  Will p.o. challenge.  Suspect gastroenteritis as cause of his symptoms.  Care transferred to oncoming provider who will follow up on reassessment and determine disposition.    MDM Rules/Calculators/A&P                            Final Clinical Impression(s) / ED Diagnoses Final diagnoses:  Generalized abdominal pain  Nausea vomiting and diarrhea    Rx / DC Orders ED Discharge Orders          Ordered    ondansetron (ZOFRAN-ODT) 4 MG disintegrating tablet  Every 8 hours PRN        03/18/21 1448             Margurete Guaman A, PA-C 03/18/21 1502    Alvira Monday, MD 03/19/21 0021

## 2021-07-22 ENCOUNTER — Other Ambulatory Visit (HOSPITAL_COMMUNITY)
Admission: EM | Admit: 2021-07-22 | Discharge: 2021-07-26 | Disposition: A | Discharge status: Home / Self Care | Payer: No Payment, Other | Attending: Psychiatry | Admitting: Psychiatry

## 2021-07-22 DIAGNOSIS — Z20822 Contact with and (suspected) exposure to covid-19: Secondary | ICD-10-CM | POA: Insufficient documentation

## 2021-07-22 DIAGNOSIS — R45851 Suicidal ideations: Secondary | ICD-10-CM | POA: Insufficient documentation

## 2021-07-22 DIAGNOSIS — F332 Major depressive disorder, recurrent severe without psychotic features: Secondary | ICD-10-CM | POA: Diagnosis present

## 2021-07-22 DIAGNOSIS — Z79899 Other long term (current) drug therapy: Secondary | ICD-10-CM | POA: Insufficient documentation

## 2021-07-22 MED ORDER — ACETAMINOPHEN 325 MG PO TABS: 650.0000 mg | ORAL_TABLET | Freq: Four times a day (QID) | ORAL | Status: DC | PRN

## 2021-07-22 MED ORDER — TRAZODONE HCL 50 MG PO TABS
50.0000 mg | ORAL_TABLET | Freq: Every evening | ORAL | Status: DC | PRN
  Administered 2021-07-23 – 2021-07-25 (×3): 50 mg via ORAL
  Filled 2021-07-22: qty 1
  Filled 2021-07-22: qty 7
  Filled 2021-07-22 (×2): qty 1

## 2021-07-22 MED ORDER — ALUM & MAG HYDROXIDE-SIMETH 200-200-20 MG/5ML PO SUSP: 30.0000 mL | ORAL | Status: DC | PRN

## 2021-07-22 MED ORDER — HYDROXYZINE HCL 25 MG PO TABS
25.0000 mg | ORAL_TABLET | Freq: Three times a day (TID) | ORAL | Status: DC | PRN
  Administered 2021-07-23 – 2021-07-25 (×5): 25 mg via ORAL
  Filled 2021-07-22 (×3): qty 1
  Filled 2021-07-22: qty 10
  Filled 2021-07-22 (×2): qty 1

## 2021-07-22 MED ORDER — MAGNESIUM HYDROXIDE 400 MG/5ML PO SUSP: 30.0000 mL | Freq: Every day | ORAL | Status: DC | PRN

## 2021-07-22 MED ORDER — SERTRALINE HCL 25 MG PO TABS
25.0000 mg | ORAL_TABLET | Freq: Every day | ORAL | Status: DC
Start: 2021-07-23 — End: 2021-07-23

## 2021-07-23 ENCOUNTER — Encounter (HOSPITAL_COMMUNITY): Payer: Self-pay | Admitting: Psychiatry

## 2021-07-23 DIAGNOSIS — F332 Major depressive disorder, recurrent severe without psychotic features: Secondary | ICD-10-CM | POA: Diagnosis not present

## 2021-07-23 DIAGNOSIS — Z20822 Contact with and (suspected) exposure to covid-19: Secondary | ICD-10-CM | POA: Diagnosis not present

## 2021-07-23 DIAGNOSIS — Z79899 Other long term (current) drug therapy: Secondary | ICD-10-CM | POA: Diagnosis not present

## 2021-07-23 DIAGNOSIS — R45851 Suicidal ideations: Secondary | ICD-10-CM | POA: Diagnosis not present

## 2021-07-23 LAB — POC SARS CORONAVIRUS 2 AG -  ED: SARS Coronavirus 2 Ag: NEGATIVE

## 2021-07-23 LAB — COMPREHENSIVE METABOLIC PANEL
ALT: 13 U/L (ref 0–44)
AST: 16 U/L (ref 15–41)
Albumin: 4.4 g/dL (ref 3.5–5.0)
Alkaline Phosphatase: 60 U/L (ref 38–126)
Anion gap: 10 (ref 5–15)
BUN: 11 mg/dL (ref 6–20)
CO2: 23 mmol/L (ref 22–32)
Calcium: 9.7 mg/dL (ref 8.9–10.3)
Chloride: 103 mmol/L (ref 98–111)
Creatinine, Ser: 0.9 mg/dL (ref 0.61–1.24)
GFR, Estimated: >60 mL/min (ref >60)
Glucose, Bld: 91 mg/dL (ref 70–99)
Potassium: 3.9 mmol/L (ref 3.5–5.1)
Sodium: 136 mmol/L (ref 135–145)
Total Bilirubin: 0.7 mg/dL (ref 0.3–1.2)
Total Protein: 6.8 g/dL (ref 6.5–8.1)

## 2021-07-23 LAB — CBC WITH DIFFERENTIAL/PLATELET
Abs Immature Granulocytes: 0.02 10*3/uL (ref 0.00–0.07)
Basophils Absolute: 0.1 10*3/uL (ref 0.0–0.1)
Basophils Relative: 1 %
Eosinophils Absolute: 0.1 10*3/uL (ref 0.0–0.5)
Eosinophils Relative: 1 %
HCT: 43.8 % (ref 39.0–52.0)
Hemoglobin: 15.3 g/dL (ref 13.0–17.0)
Immature Granulocytes: 0 %
Lymphocytes Relative: 33 %
Lymphs Abs: 3 10*3/uL (ref 0.7–4.0)
MCH: 28.5 pg (ref 26.0–34.0)
MCHC: 34.9 g/dL (ref 30.0–36.0)
MCV: 81.7 fL (ref 80.0–100.0)
Monocytes Absolute: 0.7 10*3/uL (ref 0.1–1.0)
Monocytes Relative: 7 %
Neutro Abs: 5.3 10*3/uL (ref 1.7–7.7)
Neutrophils Relative %: 58 %
Platelets: 270 10*3/uL (ref 150–400)
RBC: 5.36 MIL/uL (ref 4.22–5.81)
RDW: 12.3 % (ref 11.5–15.5)
WBC: 9.2 10*3/uL (ref 4.0–10.5)
nRBC: 0 % (ref 0.0–0.2)

## 2021-07-23 LAB — POCT URINE DRUG SCREEN - MANUAL ENTRY (I-SCREEN)
POC Amphetamine UR: NOT DETECTED
POC Buprenorphine (BUP): NOT DETECTED
POC Cocaine UR: NOT DETECTED
POC Marijuana UR: POSITIVE — AB
POC Methadone UR: NOT DETECTED
POC Methamphetamine UR: NOT DETECTED
POC Morphine: NOT DETECTED
POC Oxazepam (BZO): NOT DETECTED
POC Oxycodone UR: NOT DETECTED
POC Secobarbital (BAR): NOT DETECTED

## 2021-07-23 LAB — LIPID PANEL
Cholesterol: 169 mg/dL (ref 0–200)
HDL: 55 mg/dL
LDL Cholesterol: 100 mg/dL — ABNORMAL HIGH (ref 0–99)
Total CHOL/HDL Ratio: 3.1 ratio
Triglycerides: 71 mg/dL
VLDL: 14 mg/dL (ref 0–40)

## 2021-07-23 LAB — HEMOGLOBIN A1C
Hgb A1c MFr Bld: 5.2 % (ref 4.8–5.6)
Mean Plasma Glucose: 102.54 mg/dL

## 2021-07-23 LAB — TSH: TSH: 1.51 u[IU]/mL (ref 0.350–4.500)

## 2021-07-23 LAB — RESP PANEL BY RT-PCR (FLU A&B, COVID) ARPGX2
Influenza A by PCR: NEGATIVE
Influenza B by PCR: NEGATIVE
SARS Coronavirus 2 by RT PCR: NEGATIVE

## 2021-07-23 MED ORDER — SERTRALINE HCL 50 MG PO TABS
50.0000 mg | ORAL_TABLET | Freq: Every day | ORAL | Status: DC
  Administered 2021-07-23 – 2021-07-26 (×4): 50 mg via ORAL
  Filled 2021-07-23 (×3): qty 1
  Filled 2021-07-23: qty 7
  Filled 2021-07-23: qty 1

## 2021-07-23 NOTE — ED Provider Notes (Addendum)
[facility] Continuous Assessment Admission H&P ? ?Date: 07/23/21 ?Patient Name: Tim PLYLER ?MRN: [id number] ?Chief Complaint:  ?Chief Complaint  ?Patient presents with  ? Suicidal  ?   ? ?Diagnoses:  ?Final diagnoses:  ?Severe episode of recurrent major depressive disorder, without psychotic features ([place])  ? ? ?HPI: Tim Atkinson is a 36 year old male with a self-reported psychiatric history of depression and anxiety.  Patient presented voluntarily to [facility] for a walk-in assessment.  Patient presented with chief complaint of worsening depressive symptoms and suicidal ideation. ? ?Patient was seen face-to-face and his chart was reviewed by this nurse practitioner. He is alert and oriented x4.  Patient is calm and cooperative; he is speaking in a normal tone of voice at moderate rate with good eye contact.  Patient's mood is depressed with tearful affect.  His thought process is coherent and relevant.  There is no indication that he was responding to internal/external stimuli or experiencing any delusional thought content during this assessment. ? ?Patient reports that he is followed by Dr. Rowe Robert for outpatient psychiatric services.  He reports that he is prescribed Zoloft 50 mg for depression and hydroxyzine 25 mg 3 times daily as needed for anxiety.  He says that he stopped taking his medication about a week ago due to " not feeling like myself."  Patient reported that he has been having more depressive symptoms since self discontinued antidepressant.  He reports that he broke up with his fianc?e tonight after a 5-year relationship.  Patient reported that he is depressed and having suicidal ideations.  He reports that he is unable to contract for safety.  Patient is endorsing depressive symptoms of worthlessness, hopelessness, sadness, isolation, crying, poor energy, poor focus, and poor sleep. ?Patient denies homicidal ideation, visual hallucination, paranoia, and substance abuse.  Patient shares  that he has on and off auditory hallucination of "voices saying what is wrong with you, what is your problem?' ? ?PHQ 2-9:   ?Flowsheet Row ED from 03/18/2021 in [place] ED from 07/04/2020 in [place] ED from 07/01/2020 in [place]  ?C-SSRS RISK CATEGORY No Risk No Risk No Risk  ? ?  ?  ? ?Total Time spent with patient: 15 minutes ? ?Musculoskeletal  ?Strength & Muscle Tone: within normal limits ?Gait & Station: normal ?Patient leans: Right ? ?Psychiatric Specialty Exam  ?Presentation ?General Appearance: Appropriate for Environment ? ?Eye Contact:Minimal ? ?Speech:Clear and Coherent ? ?Speech Volume:Normal ? ?Handedness:Right ? ? ?Mood and Affect  ?Mood:Depressed ? ?Affect:Congruent ? ? ?Thought Process  ?Thought Processes:Coherent ? ?Descriptions of Associations:Intact ? ?Orientation:Full (Time, Place and Person) ? ?Thought Content:WDL ?   ?Hallucinations:Hallucinations: Auditory ?Description of Auditory Hallucinations: "voices saying what's wrong with you, what's your problem?" ? ?Ideas of Reference:None ? ?Suicidal Thoughts:Suicidal Thoughts: Yes, Active ?SI Active Intent and/or Plan: Without Plan ? ?Homicidal Thoughts:Homicidal Thoughts: No ? ? ?Sensorium  ?Memory:Immediate Good; Remote Good; Recent Good ? ?Judgment:Fair ? ?Insight:Good ? ? ?Executive Functions  ?Concentration:Good ? ?Attention Span:Good ? ?Recall:Good ? ?Fund of [place] ? ?Language:Good ? ? ?Psychomotor Activity  ?Psychomotor Activity:Psychomotor Activity: Normal ? ? ?Assets  ?Assets:Communication Skills; Desire for Improvement; Housing; Physical Health; Social Support ? ? ?Sleep  ?Sleep:Sleep: Fair ?Number of Hours of Sleep: 5 ? ? ?Nutritional Assessment (For OBS and FBC admissions only) ?Has the patient had a weight loss or gain of 10 pounds or more in the last 3 months?: No ?  Has the patient had a decrease in food  intake/or appetite?: Yes ?Does the patient have dental problems?: No ?Does the patient have eating habits or behaviors that may be indicators of an eating disorder including binging or inducing vomiting?: No ?Has the patient recently lost weight without trying?: 0 ?Has the patient been eating poorly because of a decreased appetite?: 0 ?Malnutrition Screening Tool Score: 0 ? ? ? ?Physical Exam ?Vitals and nursing note reviewed.  ?Constitutional:   ?   General: He is not in acute distress. ?   Appearance: He is well-developed.  ?HENT:  ?   Head: Normocephalic and atraumatic.  ?Eyes:  ?   Conjunctiva/sclera: Conjunctivae normal.  ?Cardiovascular:  ?   Rate and Rhythm: Normal rate.  ?   Heart sounds: No murmur heard. ?Pulmonary:  ?   Effort: Pulmonary effort is normal. No respiratory distress.  ?   Breath sounds: Normal breath sounds.  ?Abdominal:  ?   Palpations: Abdomen is soft.  ?   Tenderness: There is no abdominal tenderness.  ?Musculoskeletal:  ?   Cervical back: Neck supple.  ?Skin: ?   General: Skin is warm and dry.  ?   Capillary Refill: Capillary refill takes less than 2 seconds.  ?Neurological:  ?   Mental Status: He is alert and oriented to person, place, and time.  ?Psychiatric:     ?   Attention and Perception: He is attentive. He perceives auditory hallucinations. He does not perceive visual hallucinations.     ?   Mood and Affect: Mood is depressed.     ?   Speech: Speech normal.     ?   Behavior: Behavior normal. Behavior is cooperative.     ?   Thought Content: Thought content includes suicidal ideation.  ? ?Review of Systems  ?Constitutional: Negative.   ?HENT: Negative.    ?Eyes: Negative.   ?Respiratory: Negative.    ?Cardiovascular: Negative.   ?Gastrointestinal: Negative.   ?Genitourinary: Negative.   ?Musculoskeletal: Negative.   ?Skin: Negative.   ?Neurological: Negative.   ?Endo/Heme/Allergies: Negative.   ?Psychiatric/Behavioral:  Positive for depression, hallucinations and suicidal ideas. The  patient is nervous/anxious.   ? ?Blood pressure 130/86, pulse 74, temperature 98.5 ?F (36.9 ?C), temperature source Oral, resp. rate 19, SpO2 96 %. There is no height or weight on file to calculate BMI. ? ?Past Psychiatric History: Depression, anxiety ? ?Is the patient at risk to self? Yes  ?Has the patient been a risk to self in the past 6 months? No .    ?Has the patient been a risk to self within the distant past? No   ?Is the patient a risk to others? No   ?Has the patient been a risk to others in the past 6 months? No   ?Has the patient been a risk to others within the distant past? No  ? ?Past Medical History: No past medical history on file.  ?Past Surgical History:  ?Procedure Laterality Date  ? EYE SURGERY    ? TOE SURGERY    ? ? ?Family History:  ?Family History  ?Problem Relation Age of Onset  ? Diverticulitis Mother   ? ? ?Social History:  ?Social History  ? ?Socioeconomic History  ? Marital status: Single  ?  Spouse name: Not on file  ? Number of children: Not on file  ? Years of education: Not on file  ? Highest education level: Not on file  ?Occupational History  ? Not on file  ?  Tobacco Use  ? Smoking status: Some Days  ?  Types: Cigarettes  ? Smokeless tobacco: Never  ?Vaping Use  ? Vaping Use: Never used  ?Substance and Sexual Activity  ? Alcohol use: Yes  ?  Comment: occasionally  ? Drug use: No  ? Sexual activity: Not on file  ?Other Topics Concern  ? Not on file  ?Social History Narrative  ? Not on file  ? ?Social Determinants of Health  ? ?Financial Resource Strain: Not on file  ?Food Insecurity: Not on file  ?Transportation Needs: Not on file  ?Physical Activity: Not on file  ?Stress: Not on file  ?Social Connections: Not on file  ?Intimate Partner Violence: Not on file  ? ? ?SDOH:  ?SDOH Screenings  ? ?Alcohol Screen: Not on file  ?Depression (PHQ2-9): Not on file  ?Financial Resource Strain: Not on file  ?Food Insecurity: Not on file  ?Housing: Not on file  ?Physical Activity: Not on file   ?Social Connections: Not on file  ?Stress: Not on file  ?Tobacco Use: High Risk  ? Smoking Tobacco Use: Some Days  ? Smokeless Tobacco Use: Never  ? Passive Exposure: Not on file  ?Transportation Needs:

## 2021-07-23 NOTE — ED Notes (Signed)
Pt sitting in dining room watching TV. A&O x4, calm and cooperative. Denies current SI/HI/AVH. Denies any current needs. No signs of acute distress noted. Will continue to monitor for safety.  ?

## 2021-07-23 NOTE — ED Notes (Signed)
Pt admitted to [facility] endorsing  worsening depression and SI. Pt denies SI,HI,AVH at present.  Patient was cooperative during the admission assessment. Skin assessment complete. Belongings inventoried. Patient oriented to unit and unit rules. Meal and drinks offered to patient.  Patient verbalized agreement to treatment plans. Patient verbally contracts for safety while hospitalized. Will monitor for safety.  ?

## 2021-07-23 NOTE — ED Notes (Signed)
Pt asleep in bed. Respirations even and unlabored. Will continue to monitor for safety. ?
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
## 2021-07-23 NOTE — ED Notes (Signed)
Pt is in the bed sleeping. Respirations are even and unlabored. No acute distress noted. Will continue to monitor for safety. 

## 2021-07-23 NOTE — ED Notes (Addendum)
Pt admitted voluntarily to [facility] unit endorsing passive SI and depression. Pt states, "I don't like feeling like this". Pt flat, depressed. Per report, pt recently broke up with fiance after 5 years relationship and have been non-compliant with medications x 1 week. Denies HI/AVH. Cooperative throughout assessment. Oriented to unit and unit rules. Currently eating lunch. Continue to endorse passive SI but verbally contract for safety at facility. Will continue to monitor for safety. ?
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
## 2021-07-23 NOTE — Progress Notes (Signed)
?   07/22/21 2320  ?[facility] Triage Screening (Walk-ins at [facility] only)  ?How Did You Hear About [place]? Self  ?What Is the Reason for Your Visit/Call Today? Tim Atkinson is a 36 year old male presenting as a voluntary walk-in to [facility] due to SI with no plan. Patient denied HI, psychosis and alcohol/drug usage. Patient reported main stressor is breaking up with fiance 2 hours ago, patient reported no other stressors. Patient reported currently seeing Dr. Christin Fudge, psychiatrist and Christen Bame, therapist at [facility] [place]. Patient reported being not sure if psych medications were working. Patient reported being diagnosed with PTSD from being incarcerated for 12.5 years total. Patient was released 06/2020. Patient reported worsening depressive symptoms. Patient reported 3 hrs of sleep and poor appetite. Patient denied prior psych hospitalizations, suicide attempts and self-harming behaviors. Patient was cooperative during assessment.  ?How Long Has This Been Causing You Problems? <Week  ?Have You Recently Had Any Thoughts About Hurting Yourself? Yes  ?How long ago did you have thoughts about hurting yourself? "2 hours ago"  ?Are You Planning to Commit Suicide/Harm Yourself At This time?  ?(Unabe to contract for safety)  ?Have you Recently Had Thoughts About Hurting Someone [name]? No  ?Are You Planning To Harm Someone At This Time? No  ?Are you currently experiencing any auditory, visual or other hallucinations? No  ?Have You Used Any Alcohol or Drugs in the Past 24 Hours? No  ?Do you have any current medical co-morbidities that require immediate attention? No  ?Clinician description of patient physical appearance/behavior: casual / cooperative  ?What Do You Feel Would Help You the Most Today? Stress Management  ?If access to [facility] was not available, would you have sought care in the Emergency Department? Yes  ?Determination of Need Urgent (48 hours)  ?Options For Referral Medication Management;Outpatient  Therapy  ? ? ?

## 2021-07-23 NOTE — ED Notes (Signed)
Patient asleep at this time without distress or complaint.  Denies avh shi or plan will monitor and provide support as needed. ?

## 2021-07-23 NOTE — ED Notes (Signed)
Pt became anxious after getting off the phone with significant other. Pt requested Vistaril for anxiety. Medication administered. Pt currently in dayroom watching tv. Safety maintained. ?

## 2021-07-24 DIAGNOSIS — F332 Major depressive disorder, recurrent severe without psychotic features: Secondary | ICD-10-CM | POA: Diagnosis not present

## 2021-07-24 DIAGNOSIS — Z20822 Contact with and (suspected) exposure to covid-19: Secondary | ICD-10-CM | POA: Diagnosis not present

## 2021-07-24 DIAGNOSIS — Z79899 Other long term (current) drug therapy: Secondary | ICD-10-CM | POA: Diagnosis not present

## 2021-07-24 DIAGNOSIS — R45851 Suicidal ideations: Secondary | ICD-10-CM | POA: Diagnosis not present

## 2021-07-24 MED ORDER — MIRTAZAPINE 7.5 MG PO TABS
7.5000 mg | ORAL_TABLET | Freq: Every day | ORAL | Status: DC
  Administered 2021-07-24 – 2021-07-25 (×2): 7.5 mg via ORAL
  Filled 2021-07-24: qty 1
  Filled 2021-07-24: qty 7
  Filled 2021-07-24: qty 1

## 2021-07-24 NOTE — Group Note (Signed)
Group Topic: Recovery Basics  ?Group Date: 07/24/2021 ?Start Time: 1320 ?End Time: 1340 ?Facilitators: Doyne Keel E  ?Department: [facility] ? ?Number of Participants: 3  ?Group Focus: coping skills and daily focus ?Treatment Modality:  Psychoeducation and Solution-Focused Therapy ?Interventions utilized were patient education ?Purpose: enhance coping skills ? ?Name: Tim Atkinson Date of Birth: [birth year]  ?MR: [id number]   ? ?Level of Participation: withdrawn ?Quality of Participation: attentive ?Interactions with others: gave feedback ?Mood/Affect: appropriate ?Triggers (if applicable): n/a ?Cognition: coherent/clear ?Progress: Moderate ?Response: n/a ?Plan: follow-up needed ? ?Patients Problems:  ?Patient Active Problem List  ? Diagnosis Date Noted  ? MDD (major depressive disorder), recurrent episode, severe (HCC) 07/23/2021  ?  ?

## 2021-07-24 NOTE — ED Notes (Signed)
Pt. Is attending AA. ?

## 2021-07-24 NOTE — ED Notes (Signed)
Pt sleeping in no acute distress. RR even and unlabored. Safety maintained. 

## 2021-07-24 NOTE — Clinical Social Work Psych Note (Signed)
LCSW Initial Note  ? ?LCSW met with Tim Atkinson to discuss treatment and potential discharge planning.  ? ?Tim Atkinson presented to the [facility] seeking assistance with worsening depression and suicidal ideation with no plan. Tim Atkinson that his main stressor is breaking up with fiance 2 hours prior to him coming to the [facility].  Tim Atkinson also  reported he was unsure if his psychiatric medications were "working". Tim Atkinson shared that he was diagnosed with PTSD from being incarcerated for 12.5 years total. Tim Atkinson was released March 2022.  ? ? ?Tim Atkinson reports that he feels "okay" today. He denied having any SI, HI or AVH at this time. Tim Atkinson shared with providers that he would be ready for discharge tomorrow.  ? ?Tim Atkinson has an upcoming appointment with his outpatient psychiatrist, Dr. Rowe Robert on 07/26/21 at 4:00pm. He has a therapy appointment with Jola Babinski on 08/13/2021 at 2:00pm. Both appointments will be held at [facility].  ? ?Tim Atkinson reports that he plans to return home with his grandmother at discharge tomorrow.  ? ?Tim Atkinson denied having any additional questions or concerns.  ? ?LCSW will continue to follow until discharge.  ? ? ?Tim Atkinson, MSW, LCSW ?Clinical Education officer, museum Insurance claims handler) ?[facility]  ? ?

## 2021-07-24 NOTE — ED Provider Notes (Signed)
Behavioral Health Progress Note ? ?Date and Time: 07/24/2021 1:49 PM ?Name: Tim GalasDominic L Lenart ?MRN:  [id number] ? ?Subjective:   ?36 yo male with self reported history of PTSD, anxiety and depression who [facility] for SI and worsening depressive symptoms in the context of a recent break up with his significant other. He was admitted to the observation unit.UDS+marijuana; etoh not collected. ? ?Patient seen and chart reviewed. He has been medication compliant and has been appropriate with staff and peers on the unit. Patient interviewed outside this afternoon, he is calm, cooperative, pleasant and more engaged than yesterday. He describes his mood as "pretty good" and denies SI/HI/AVH. He states that he would like to discharge tomorrow 4/19 and states that he has an appointment with his outpatient provider, Dr. Christin FudgeSteele, on 4/20. He reported to nursing staff that he is interested in an appetite stimulant. On discussion with patient he reports low appetite, which is more noticeable since his admission. Discussed r/b/se/ae of starting low dose remeron 7.5 mg to assist with mood/anxiety/appetite and patient is agreeable. He states that he spoke to his ex girlfriend earlier today and that it made him feel "anxious"; he states that he has also spoken to his [facility] isnce [facility] and describes the conversation as "good".He denies physical complaints. Discussed discharge planning with patient and informed him that as long as there are no safety concerns, there will be no issue with discharge tomorrow. Pt verbalized understanding and was in agreement. ? ?Diagnosis:  ?Final diagnoses:  ?Severe episode of recurrent major depressive disorder, without psychotic features (HCC)  ? ? ?Total Time spent with patient: 15 minutes ? ?Past Psychiatric History: PTSD, anxiety, depression ?Past Medical History: No past medical history on file.  ?Past Surgical History:  ?Procedure Laterality Date  ? EYE  SURGERY    ? TOE SURGERY    ? ?Family History:  ?Family History  ?Problem Relation Age of Onset  ? Diverticulitis Mother   ? ?Family Psychiatric  History: Brother with "mental health problems" ?Social History:  ?Social History  ? ?Substance and Sexual Activity  ?Alcohol Use Yes  ? Comment: occasionally  ?   ?Social History  ? ?Substance and Sexual Activity  ?Drug Use No  ?  ?Social History  ? ?Socioeconomic History  ? Marital status: Single  ?  Spouse name: Not on file  ? Number of children: Not on file  ? Years of education: Not on file  ? Highest education level: Not on file  ?Occupational History  ? Not on file  ?Tobacco Use  ? Smoking status: Some Days  ?  Packs/day: 1.00  ?  Types: Cigarettes  ? Smokeless tobacco: Never  ?Vaping Use  ? Vaping Use: Never used  ?Substance and Sexual Activity  ? Alcohol use: Yes  ?  Comment: occasionally  ? Drug use: No  ? Sexual activity: Not on file  ?Other Topics Concern  ? Not on file  ?Social History Narrative  ? Not on file  ? ?Social Determinants of Health  ? ?Financial Resource Strain: Not on file  ?Food Insecurity: Not on file  ?Transportation Needs: Not on file  ?Physical Activity: Not on file  ?Stress: Not on file  ?Social Connections: Not on file  ? ?SDOH:  ?SDOH Screenings  ? ?Alcohol Screen: Not on file  ?Depression (PHQ2-9): Medium Risk  ? PHQ-2 Score: 22  ?Financial Resource Strain: Not on file  ?Food Insecurity: Not on file  ?  Housing: Not on file  ?Physical Activity: Not on file  ?Social Connections: Not on file  ?Stress: Not on file  ?Tobacco Use: High Risk  ? Smoking Tobacco Use: Some Days  ? Smokeless Tobacco Use: Never  ? Passive Exposure: Not on file  ?Transportation Needs: Not on file  ? ?Additional Social History:  ?  ?Pain Medications: see MAR ?Prescriptions: see MAR ?Over the Counter: see MAR ?History of alcohol / drug use?: No history of alcohol / drug abuse ?  ?  ?  ?  ?  ?  ?  ?  ?  ? ?Sleep: Fair ? ?Appetite:  Poor ? ?Current Medications:  ?Current  Facility-Administered Medications  ?Medication Dose Route Frequency Provider Last Rate Last Admin  ? acetaminophen (TYLENOL) tablet 650 mg  650 mg Oral Q6H PRN Ajibola, Ene A, NP      ? alum & mag hydroxide-simeth (MAALOX/MYLANTA) 200-200-20 MG/5ML suspension 30 mL  30 mL Oral Q4H PRN Ajibola, Ene A, NP      ? hydrOXYzine (ATARAX) tablet 25 mg  25 mg Oral TID PRN Ajibola, Ene A, NP   25 mg at 07/24/21 1006  ? magnesium hydroxide (MILK OF MAGNESIA) suspension 30 mL  30 mL Oral Daily PRN Ajibola, Ene A, NP      ? sertraline (ZOLOFT) tablet 50 mg  50 mg Oral Daily Ajibola, Ene A, NP   50 mg at 07/24/21 0914  ? traZODone (DESYREL) tablet 50 mg  50 mg Oral QHS PRN Ajibola, Ene A, NP   50 mg at 07/23/21 0059  ? ?Current Outpatient Medications  ?Medication Sig Dispense Refill  ? Multiple Vitamin (MULTIVITAMIN WITH MINERALS) TABS tablet Take 1 tablet by mouth daily.    ? VISTARIL 25 MG capsule Take 25 mg by mouth 3 (three) times daily as needed for anxiety.    ? ZOLOFT 50 MG tablet Take 50 mg by mouth daily.    ? ? ?Labs  ?Lab Results:  ?Admission on 07/22/2021  ?Component Date Value Ref Range Status  ? SARS Coronavirus 2 by RT PCR 07/22/2021 NEGATIVE  NEGATIVE Final  ? Comment: (NOTE) ?SARS-CoV-2 target nucleic acids are NOT DETECTED. ? ?The SARS-CoV-2 RNA is generally detectable in upper respiratory ?specimens during the acute phase of infection. The lowest ?concentration of SARS-CoV-2 viral copies this assay can detect is ?138 copies/mL. A negative result does not preclude SARS-Cov-2 ?infection and should not be used as the sole basis for treatment or ?other patient management decisions. A negative result may occur with  ?improper specimen collection/handling, submission of specimen other ?than nasopharyngeal swab, presence of viral mutation(s) within the ?areas targeted by this assay, and inadequate number of viral ?copies(<138 copies/mL). A negative result must be combined with ?clinical observations, patient history,  and epidemiological ?information. The expected result is Negative. ? ?Fact Sheet for Patients:  ?BloggerCourse.com ? ?Fact Sheet for Healthcare Providers:  ?SeriousBroker.it ? ?This test is no  ?                        t yet approved or cleared by the [place] FDA and  ?has been authorized for detection and/or diagnosis of SARS-CoV-2 by ?FDA under an Emergency Use Authorization (EUA). This EUA will remain  ?in effect (meaning this test can be used) for the duration of the ?COVID-19 declaration under Section 564(b)(1) of the Act, 21 ?U.S.C.section 360bbb-3(b)(1), unless the authorization is terminated  ?or revoked sooner.  ? ? ?  ?  Influenza A by PCR 07/22/2021 NEGATIVE  NEGATIVE Final  ? Influenza B by PCR 07/22/2021 NEGATIVE  NEGATIVE Final  ? Comment: (NOTE) ?The Xpert Xpress SARS-CoV-2/FLU/RSV plus assay is intended as an aid ?in the diagnosis of influenza from Nasopharyngeal swab specimens and ?should not be used as a sole basis for treatment. Nasal washings and ?aspirates are unacceptable for Xpert Xpress SARS-CoV-2/FLU/RSV ?testing. ? ?Fact Sheet for Patients: ?BloggerCourse.com ? ?Fact Sheet for Healthcare Providers: ?SeriousBroker.it ? ?This test is not yet approved or cleared by the [place] FDA and ?has been authorized for detection and/or diagnosis of SARS-CoV-2 by ?FDA under an Emergency Use Authorization (EUA). This EUA will remain ?in effect (meaning this test can be used) for the duration of the ?COVID-19 declaration under Section 564(b)(1) of the Act, 21 U.S.C. ?section 360bbb-3(b)(1), unless the authorization is terminated or ?revoked. ? ?Performed at [facility], [address], [place], [place] ?[address] ?  ? WBC 07/22/2021 9.2  4.0 - 10.5 K/uL Final  ? RBC 07/22/2021 5.36  4.22 - 5.81 MIL/uL Final  ? Hemoglobin 07/22/2021 15.3  13.0 - 17.0 g/dL Final  ? HCT 61/60/7371 43.8  39.0 -  52.0 % Final  ? MCV 07/22/2021 81.7  80.0 - 100.0 fL Final  ? MCH 07/22/2021 28.5  26.0 - 34.0 pg Final  ? MCHC 07/22/2021 34.9  30.0 - 36.0 g/dL Final  ? RDW 10/02/9483 12.3  11.5 - 15.5 % Final  ? [place]

## 2021-07-24 NOTE — ED Notes (Signed)
Pt provided breakfast.  Denies SI, HI, and AVH.  Reports his mood is "okay".  When asked how he slept pt reported "I guess okay."   Provided minimal information when speaking with this Clinical research associate.  Breathing is even and unlabored.  Will continue to monitor for safety. ?

## 2021-07-24 NOTE — ED Notes (Signed)
Pt asleep in bed. Respirations even and unlabored. Will continue to monitor for safety. ?

## 2021-07-24 NOTE — ED Notes (Signed)
Pt got a snack. ?

## 2021-07-24 NOTE — ED Notes (Signed)
Pt eating lunch in no acute distress. Reports Vistaril effective in managing his anxiety. Safety maintained. ?

## 2021-07-24 NOTE — ED Notes (Signed)
Pt in room asleep.  Breathing is unlabored and even. Will continue to monitor for safety.  ?

## 2021-07-24 NOTE — ED Notes (Signed)
Pt approached nurses station requesting an appetite stimulant. Pt states, "I'm use to smoking weed everyday and I think that has affected my appetite. Do you have anything I can take to help me eat more". RN recommended nicotine patch or gum but pt declined. Secure chatted provider with pt request. Awaiting results. Pt made aware. ?

## 2021-07-24 NOTE — ED Notes (Signed)
Pt sitting in dining room watching TV. A&O x4, calm and cooperative. Denies current SI/HI/AVH. Denies any current needs. No signs of acute distress noted. Will continue to monitor for safety.  ?

## 2021-07-24 NOTE — ED Notes (Signed)
Pt sleeping@this time. Breathing even and unlabored. Will continue to monitor for safety 

## 2021-07-25 ENCOUNTER — Encounter (HOSPITAL_COMMUNITY): Payer: Self-pay

## 2021-07-25 DIAGNOSIS — Z79899 Other long term (current) drug therapy: Secondary | ICD-10-CM | POA: Diagnosis not present

## 2021-07-25 DIAGNOSIS — F332 Major depressive disorder, recurrent severe without psychotic features: Secondary | ICD-10-CM | POA: Diagnosis not present

## 2021-07-25 DIAGNOSIS — R45851 Suicidal ideations: Secondary | ICD-10-CM | POA: Diagnosis not present

## 2021-07-25 DIAGNOSIS — Z20822 Contact with and (suspected) exposure to covid-19: Secondary | ICD-10-CM | POA: Diagnosis not present

## 2021-07-25 MED ORDER — MIRTAZAPINE 7.5 MG PO TABS: 7.5000 mg | ORAL_TABLET | Freq: Every day | ORAL | 0 refills | Status: DC

## 2021-07-25 MED ORDER — TRAZODONE HCL 50 MG PO TABS: 50.0000 mg | ORAL_TABLET | Freq: Every evening | ORAL | 0 refills | Status: DC | PRN

## 2021-07-25 MED ORDER — ZOLOFT 50 MG PO TABS: 50.0000 mg | ORAL_TABLET | Freq: Every day | ORAL | 0 refills | Status: DC

## 2021-07-25 MED ORDER — VISTARIL 25 MG PO CAPS
25.0000 mg | ORAL_CAPSULE | Freq: Three times a day (TID) | ORAL | 0 refills | Status: AC | PRN
Start: 2021-07-25 — End: ?

## 2021-07-25 NOTE — ED Provider Notes (Incomplete)
[facility]/OBS ASAP Discharge Summary ? ?Date and Time: 07/25/2021 4:18 PM  ?Name: Tim Atkinson  ?MRN:  [id number]  ? ?Discharge Diagnoses:  ?Final diagnoses:  ?Severe episode of recurrent major depressive disorder, without psychotic features (HCC)  ? ? ?Subjective:  ?Patient seen and chart reviewed-he has been medication compliant and has been appropriate with staff and peers on the unit. He describes his mood as ***. He reports sleeping *** and denies***. He verbalizes readiness for discharge and is aware that he has an appointment with Dr. Christin Fudge later this afternoon. Discussed with patient that he will be provided with 7 day samples and 30 days of medication at discharge.  ? ?Stay Summary:  ?36 yo male with self reported history of PTSD, anxiety and depression who presented to the [facility] on 07/23/21 for SI and worsening depressive symptoms in the context of a recent break up with his significant other. He was admitted to the observation unit.UDS+marijuana; etoh not collected. He was restarted on his previously prescribed zoloft 50 mg daily on admission. On 4/18 remeron 7.5 mg was started as patient reported ongoing depressed mood and was requesting a medication to assist with appetite. Patient tolerated medications well without SE/AE. Patient requested discharge on 07/26/21 with plans to reside with his grandmother upon discharge; he has follow up with outpatient provider Dr. Christin Fudge 07/26/21 at 4 pm.  ? ?On my interview, day of discharge,  patient is in NAD, alert, oriented, calm, cooperative, and attentive, with normal affect, speech, and behavior. Objectively, there is no evidence of psychosis/ mania (able to converse coherently, linear and goal directed thought, no RIS, no distractibility, not pre-occupied, no FOI, etc) nor depression to the point of suicidality (able to concentrate, affect full and reactive, speech normal r/v/t, no psychomotor retardation/agitation, etc). He denies SI/HI/AVH.  ? ?Overall, patient  appears to be at the point, in the absence of inhibiting or disinhibiting symptoms, where he can successfully move to lesser restrictive setting for care. ? ? ?Total Time spent with patient: {Time; 15 min - 8 hours:17441} ? ?Past Psychiatric History: PTSD, anxiety, depression ?Past Medical History: No past medical history on file.  ?Past Surgical History:  ?Procedure Laterality Date  ? EYE SURGERY    ? TOE SURGERY    ? ?Family History:  ?Family History  ?Problem Relation Age of Onset  ? Diverticulitis Mother   ? ?Family Psychiatric History: Brother with "mental health problems" ?Social History:  ?Social History  ? ?Substance and Sexual Activity  ?Alcohol Use Yes  ? Comment: occasionally  ?   ?Social History  ? ?Substance and Sexual Activity  ?Drug Use No  ?  ?Social History  ? ?Socioeconomic History  ? Marital status: Single  ?  Spouse name: Not on file  ? Number of children: Not on file  ? Years of education: Not on file  ? Highest education level: Not on file  ?Occupational History  ? Not on file  ?Tobacco Use  ? Smoking status: Some Days  ?  Packs/day: 1.00  ?  Types: Cigarettes  ? Smokeless tobacco: Never  ?Vaping Use  ? Vaping Use: Never used  ?Substance and Sexual Activity  ? Alcohol use: Yes  ?  Comment: occasionally  ? Drug use: No  ? Sexual activity: Not on file  ?Other Topics Concern  ? Not on file  ?Social History Narrative  ? Not on file  ? ?Social Determinants of Health  ? ?Financial Resource Strain: Not on file  ?Food Insecurity:  Not on file  ?Transportation Needs: Not on file  ?Physical Activity: Not on file  ?Stress: Not on file  ?Social Connections: Not on file  ? ?SDOH:  ?SDOH Screenings  ? ?Alcohol Screen: Not on file  ?Depression (PHQ2-9): Medium Risk  ? PHQ-2 Score: 22  ?Financial Resource Strain: Not on file  ?Food Insecurity: Not on file  ?Housing: Not on file  ?Physical Activity: Not on file  ?Social Connections: Not on file  ?Stress: Not on file  ?Tobacco Use: High Risk  ? Smoking Tobacco  Use: Some Days  ? Smokeless Tobacco Use: Never  ? Passive Exposure: Not on file  ?Transportation Needs: Not on file  ? ? ?Tobacco Cessation:  Prescription not provided because: declined ? ?Current Medications:  ?Current Facility-Administered Medications  ?Medication Dose Route Frequency Provider Last Rate Last Admin  ? acetaminophen (TYLENOL) tablet 650 mg  650 mg Oral Q6H PRN Ajibola, Ene A, NP      ? alum & mag hydroxide-simeth (MAALOX/MYLANTA) 200-200-20 MG/5ML suspension 30 mL  30 mL Oral Q4H PRN Ajibola, Ene A, NP      ? hydrOXYzine (ATARAX) tablet 25 mg  25 mg Oral TID PRN Ajibola, Ene A, NP   25 mg at 07/24/21 2117  ? magnesium hydroxide (MILK OF MAGNESIA) suspension 30 mL  30 mL Oral Daily PRN Ajibola, Ene A, NP      ? mirtazapine (REMERON) tablet 7.5 mg  7.5 mg Oral QHS Estella HuskLaubach, Nyemah Watton S, MD   7.5 mg at 07/24/21 2117  ? sertraline (ZOLOFT) tablet 50 mg  50 mg Oral Daily Ajibola, Ene A, NP   50 mg at 07/25/21 0918  ? traZODone (DESYREL) tablet 50 mg  50 mg Oral QHS PRN Ajibola, Ene A, NP   50 mg at 07/24/21 2117  ? ?Current Outpatient Medications  ?Medication Sig Dispense Refill  ? Multiple Vitamin (MULTIVITAMIN WITH MINERALS) TABS tablet Take 1 tablet by mouth daily.    ? mirtazapine (REMERON) 7.5 MG tablet Take 1 tablet (7.5 mg total) by mouth at bedtime. 30 tablet 0  ? traZODone (DESYREL) 50 MG tablet Take 1 tablet (50 mg total) by mouth at bedtime as needed for sleep. 30 tablet 0  ? VISTARIL 25 MG capsule Take 1 capsule (25 mg total) by mouth 3 (three) times daily as needed for anxiety. 30 capsule 0  ? ZOLOFT 50 MG tablet Take 1 tablet (50 mg total) by mouth daily. 30 tablet 0  ? ? ?PTA Medications: (Not in a hospital admission) ? ? ?Musculoskeletal  ?Strength & Muscle Tone: within normal limits ?Gait & Station: normal ?Patient leans: {Patient Leans:[id number]} ? ?Psychiatric Specialty Exam  ?Presentation  ?General Appearance: Appropriate for Environment; Casual ? ?Eye Contact:Fair ? ?Speech:Clear and  Coherent; Normal Rate ? ?Speech Volume:Normal ? ?Handedness:Right ? ? ?Mood and Affect  ?Mood:-- ("sleepy") ? ?Affect:Appropriate; Constricted ? ? ?Thought Process  ?Thought Processes:Coherent; Goal Directed; Linear ? ?Descriptions of Associations:Intact ? ?Orientation:Full (Time, Place and Person) ? ?Thought Content:WDL; Logical ?  Duration of Psychotic Symptoms: Less than six months ? ? Hallucinations:Hallucinations: None ? ?Ideas of Reference:None ? ?Suicidal Thoughts:Suicidal Thoughts: No ? ?Homicidal Thoughts:Homicidal Thoughts: No ? ? ?Sensorium  ?Memory:Immediate Good; Recent Good; Remote Good ? ?Judgment:Fair ? ?Insight:Fair ? ? ?Executive Functions  ?Concentration:Fair ? ?Attention Span:Fair ? ?Recall:Fair ? ?Fund of Knowledge:Good ? ?Language:Good ? ? ?Psychomotor Activity  ?Psychomotor Activity:Psychomotor Activity: Normal ? ? ?Assets  ?Assets:Communication Skills; Desire for Improvement; Resilience; Social Support; Physical Health ? ? ?Sleep  ?  Sleep:Sleep: Fair ? ? ?No data recorded ? ?Physical Exam  ?Physical Exam ?ROS ?Blood pressure 109/85, pulse (!) 44, temperature 98.3 ?F (36.8 ?C), temperature source Tympanic, resp. rate 18, SpO2 100 %. There is no height or weight on file to calculate BMI. ? ?Demographic Factors:  ?Male, Low socioeconomic status, and Unemployed ? ?Loss Factors: ?Loss of significant relationship, Legal issues, and Financial problems/change in socioeconomic status ? ?Historical Factors: ?Family history of mental illness or substance abuse and Impulsivity ? ?Risk Reduction Factors:   ?Sense of responsibility to family, Living with another person, especially a relative, and Positive social support ? ?Continued Clinical Symptoms:  ?Depression:   Comorbid alcohol abuse/dependence ?Alcohol/Substance Abuse/Dependencies ?Previous Psychiatric Diagnoses and Treatments ? ?Cognitive Features That Contribute To Risk:  ?None   ? ?Suicide Risk:  ?Mild:  Suicidal ideation of limited frequency,  intensity, duration, and specificity.  There are no identifiable plans, no associated intent, mild dysphoria and related symptoms, good self-control (both objective and subjective assessment), few other risk f

## 2021-07-25 NOTE — ED Notes (Signed)
Pt refused to eat Lunch ?

## 2021-07-25 NOTE — ED Notes (Signed)
Pt did not want to attend AA ?

## 2021-07-25 NOTE — BH IP Treatment Plan (Signed)
Interdisciplinary Treatment and Diagnostic Plan Update ? ?07/25/2021 ?Time of Session: 9:30AM ? ?[facility] ?MRN: [id number] ? ?Diagnosis:  ?Final diagnoses:  ?Severe episode of recurrent major depressive disorder, without psychotic features (Tim Atkinson)  ? ? ? ?Current Medications:  ?Current Facility-Administered Medications  ?Medication Dose Route Frequency Provider Last Rate Last Admin  ? acetaminophen (TYLENOL) tablet 650 mg  650 mg Oral Q6H PRN Ajibola, Ene A, NP      ? alum & mag hydroxide-simeth (MAALOX/MYLANTA) 200-200-20 MG/5ML suspension 30 mL  30 mL Oral Q4H PRN Ajibola, Ene A, NP      ? hydrOXYzine (ATARAX) tablet 25 mg  25 mg Oral TID PRN Ajibola, Ene A, NP   25 mg at 07/24/21 2117  ? magnesium hydroxide (MILK OF MAGNESIA) suspension 30 mL  30 mL Oral Daily PRN Ajibola, Ene A, NP      ? mirtazapine (REMERON) tablet 7.5 mg  7.5 mg Oral QHS Ival Bible, MD   7.5 mg at 07/24/21 2117  ? sertraline (ZOLOFT) tablet 50 mg  50 mg Oral Daily Ajibola, Ene A, NP   50 mg at 07/25/21 0918  ? traZODone (DESYREL) tablet 50 mg  50 mg Oral QHS PRN Ajibola, Ene A, NP   50 mg at 07/24/21 2117  ? ?Current Outpatient Medications  ?Medication Sig Dispense Refill  ? Multiple Vitamin (MULTIVITAMIN WITH MINERALS) TABS tablet Take 1 tablet by mouth daily.    ? mirtazapine (REMERON) 7.5 MG tablet Take 1 tablet (7.5 mg total) by mouth at bedtime. 30 tablet 0  ? traZODone (DESYREL) 50 MG tablet Take 1 tablet (50 mg total) by mouth at bedtime as needed for sleep. 30 tablet 0  ? VISTARIL 25 MG capsule Take 1 capsule (25 mg total) by mouth 3 (three) times daily as needed for anxiety. 30 capsule 0  ? ZOLOFT 50 MG tablet Take 1 tablet (50 mg total) by mouth daily. 30 tablet 0  ? ?PTA Medications: ?Prior to Admission medications   ?Medication Sig Start Date End Date Taking? Authorizing Provider  ?Multiple Vitamin (MULTIVITAMIN WITH MINERALS) TABS tablet Take 1 tablet by mouth daily.   Yes [provider]  ?mirtazapine  (REMERON) 7.5 MG tablet Take 1 tablet (7.5 mg total) by mouth at bedtime. 07/25/21   Ival Bible, MD  ?traZODone (DESYREL) 50 MG tablet Take 1 tablet (50 mg total) by mouth at bedtime as needed for sleep. 07/25/21   Ival Bible, MD  ?VISTARIL 25 MG capsule Take 1 capsule (25 mg total) by mouth 3 (three) times daily as needed for anxiety. 07/25/21   Ival Bible, MD  ?ZOLOFT 50 MG tablet Take 1 tablet (50 mg total) by mouth daily. 07/25/21   Ival Bible, MD  ? ? ?Patient Stressors: Marital or family conflict   ?Medication change or noncompliance   ? ?Patient Strengths: General fund of knowledge  ?Supportive family/friends  ? ?Treatment Modalities: Medication Management, Group therapy, Case management,  ?1 to 1 session with clinician, Psychoeducation, Recreational therapy. ? ? ?Physician Treatment Plan for Primary and Secondary Diagnosis:  ?Final diagnoses:  ?Severe episode of recurrent major depressive disorder, without psychotic features ([place])  ? ?Long Term Goal(s): Improvement in symptoms so as ready for discharge ? ?Short Term Goals: Ability to identify changes in lifestyle to reduce recurrence of condition will improve ?Ability to verbalize feelings will improve ?Ability to disclose and discuss suicidal ideas ?Ability to demonstrate self-control will improve ?Ability to identify and develop effective  coping behaviors will improve ?Ability to maintain clinical measurements within normal limits will improve ?Compliance with prescribed medications will improve ? ?Medication Management: Evaluate patient's response, side effects, and tolerance of medication regimen. ? ?Therapeutic Interventions: 1 to 1 sessions, Unit Group sessions and Medication administration. ? ?Evaluation of Outcomes: Adequate for Discharge ? ?RN Treatment Plan for Primary Diagnosis:  ?Final diagnoses:  ?Severe episode of recurrent major depressive disorder, without psychotic features ([place])  ? ? ?Long Term Goal(s):  Knowledge of disease and therapeutic regimen to maintain health will improve ? ?Short Term Goals: Ability to participate in decision making will improve, Ability to verbalize feelings will improve, and Ability to identify and develop effective coping behaviors will improve ? ?Medication Management: RN will administer medications as ordered by provider, will assess and evaluate patient's response and provide education to patient for prescribed medication. RN will report any adverse and/or side effects to prescribing provider. ? ?Therapeutic Interventions: 1 on 1 counseling sessions, Psychoeducation, Medication administration, Evaluate responses to treatment, Monitor vital signs and CBGs as ordered, Perform/monitor CIWA, COWS, AIMS and Fall Risk screenings as ordered, Perform wound care treatments as ordered. ? ?Evaluation of Outcomes: Adequate for Discharge ? ? ?LCSW Treatment Plan for Primary Diagnosis:  ?Final diagnoses:  ?Severe episode of recurrent major depressive disorder, without psychotic features ([place])  ? ? ?Long Term Goal(s): Safe transition to appropriate next level of care at discharge, Engage patient in therapeutic group addressing interpersonal concerns. ? ?Short Term Goals: Engage patient in aftercare planning with referrals and resources and Facilitate acceptance of mental health diagnosis and concerns ? ?Therapeutic Interventions: Assess for all discharge needs, 1 to 1 time with Education officer, museum, Explore available resources and support systems, Assess for adequacy in community support network, Educate family and significant other(s) on suicide prevention, Complete Psychosocial Assessment, Interpersonal group therapy. ? ?Evaluation of Outcomes: Adequate for Discharge ? ? ?Progress in Treatment: ?Attending groups: Yes. ?Participating in groups: Yes. ?Taking medication as prescribed: Yes. ?Toleration medication: Yes. ?Family/Significant other contact made: No, will contact:  No one ?Patient understands  diagnosis: Yes. ?Discussing patient identified problems/goals with staff: Yes. ?Medical problems stabilized or resolved: Yes. ?Denies suicidal/homicidal ideation: Yes. ?Issues/concerns per patient self-inventory: No. ?Other: None  ? ?New problem(s) identified: No new problems identified ? ?New Short Term/Long Term Goal(s): Patient wants to increase the frequency of outpatient therapy sessions with his counselor from 1x a month to 1-2x a week.  ? ?Patient Goals:  "I need to get back on my medications and I needed to get away from my ex"  ? ?Discharge Plan or Barriers: Brance plans to return home with his grandmother and will continue to follow up with Dr. Rowe Robert for medication management and Jola Babinski for outpatient therapy services at [facility].  ? ?Reason for Continuation of Hospitalization: None; Patient will be discharging home tomorrow morning.  ? ?Estimated Length of Stay: Discharge, 07/26/21 ? ?Last 3 [place] Suicide Severity Risk Score: ?[place] ED from 07/22/2021 in [facility] ED from 03/18/2021 in [place] [facility] from 07/04/2020 in [place]  ?C-SSRS RISK CATEGORY Low Risk No Risk No Risk  ? ?  ? ? ?Last PHQ 2/9 Scores: ? ?  07/24/2021  ? 12:05 PM  ?Depression screen PHQ 2/9  ?Decreased Interest 3  ?Down, Depressed, Hopeless 3  ?PHQ - 2 Score 6  ?Altered sleeping 3  ?Tired, decreased energy 2  ?Change in appetite  2  ?Feeling bad or failure about yourself  3  ?Trouble concentrating 2  ?Moving slowly or fidgety/restless 3  ?Suicidal thoughts 1  ?PHQ-9 Score 22  ?Difficult doing work/chores Extremely dIfficult  ? ? ?Scribe for Treatment Team: ?Marylee Floras, LCSW ?07/25/2021 ?3:23 PM ? ?

## 2021-07-25 NOTE — Group Note (Signed)
Group Topic: Understanding Self  ?Group Date: 07/25/2021 ?Start Time: 1020 ?End Time: 1058 ?Facilitators: Doyne Keel E  ?Department: [facility] ? ?Number of Participants: 4  ?Group Focus: coping skills and personal responsibility ?Treatment Modality:  Individual Therapy and Psychoeducation ?Interventions utilized were patient education ?Purpose: enhance coping skills, regain self-worth, and reinforce self-care ? ?Name: Tim Atkinson Date of Birth: April 27, 1985  ?MR: [id number]   ? ?Level of Participation: withdrawn ?Quality of Participation: withdrawn ?Interactions with others: gave feedback only when cued ?Mood/Affect: withdrawn, had his head on the table during group ?Triggers (if applicable): n/a ?Cognition: coherent/clear ?Progress: Minimal ?Response: hardly responded ?Plan: follow-up needed ? ?Patients Problems:  ?Patient Active Problem List  ? Diagnosis Date Noted  ? MDD (major depressive disorder), recurrent episode, severe (HCC) 07/23/2021  ?  ?

## 2021-07-25 NOTE — ED Notes (Signed)
Pt watching movie with peers ?

## 2021-07-25 NOTE — Clinical Social Work Psych Note (Signed)
LCSW Update Note  ? ?Hurman denied wanting to discharge this morning, as he previously stated yesterday. Macdonald shared with Dr. Bronwen Betters, MD that he wanted another day at the [facility] so he can "come up with a better plan". Taliesin expressed concerns regarding his relationship with his ex-girlfriend.  ? ?Allison reports he continues to plan to return to his grandmother's home at discharge.  ? ?Izaan denied having any SI, HI or AVH this morning.  ? ?LCSW will continue to follow ? ? ?Baldo Daub, MSW, LCSW ?Clinical Child psychotherapist Hydrographic surveyor) ?[facility]  ? ?

## 2021-07-25 NOTE — ED Provider Notes (Signed)
Behavioral Health Progress Note ? ?Date and Time: 07/25/2021 11:50 AM ?Name: Tim GalasDominic L Schools ?MRN:  [id number] ? ?Subjective:   ?36 yo male with self reported history of PTSD, anxiety and depression [facility] SI and worsening depressive symptoms in the context of a recent break up with his significant other. He was admitted to the observation unit.UDS+marijuana; etoh not collected. ? ?Patient seen and chart reviewed. He has been medication compliant and has been appropriate with staff and peers on the unit. Staff reported that patient has been spending time with a male peer-laughing and giggling although no inappropriate behavior witnessed.  Patient interviewed in his room this morning. He describes his mood as "sleepy" and reports sleeping overall well but waking up intermittently. He denies SI/HI/AVH. Patient states that although he felt he would be ready for discharge today, he indicates that he does not feel ready to discharge as he has not gotten his "plans" together. On clarification, patient states that he is trying to figure out where he will stay upon discharge; however, reports that he has already spoken to his grandmother and that she is ok with him staying with her. Patient states that he needs to "stay away from my ex" and that is another stressor he is attempting to manage. Discussed remeron with patient, he denies SE although does report that it made him drowsy yesterday. Patient continues to report low appetite. Discussed with patient that it will take time for medication(s) to take effect. Patient verbalizes understanding. Patient is aware that he has an appointment with outpatient provider Dr. Christin FudgeSteele tomorrow and plans to follow up with him. Discussed likely discharge tomorrow. Pt verbalized understanding and was in agreement. ? ?Diagnosis:  ?Final diagnoses:  ?Severe episode of recurrent major depressive disorder, without psychotic features (HCC)  ? ? ?Total Time  spent with patient: 15 minutes ? ?Past Psychiatric History: PTSD, anxiety, depression ?Past Medical History: No past medical history on file.  ?Past Surgical History:  ?Procedure Laterality Date  ? EYE SURGERY    ? TOE SURGERY    ? ?Family History:  ?Family History  ?Problem Relation Age of Onset  ? Diverticulitis Mother   ? ?Family Psychiatric  History: Brother with "mental health problems" ?Social History:  ?Social History  ? ?Substance and Sexual Activity  ?Alcohol Use Yes  ? Comment: occasionally  ?   ?Social History  ? ?Substance and Sexual Activity  ?Drug Use No  ?  ?Social History  ? ?Socioeconomic History  ? Marital status: Single  ?  Spouse name: Not on file  ? Number of children: Not on file  ? Years of education: Not on file  ? Highest education level: Not on file  ?Occupational History  ? Not on file  ?Tobacco Use  ? Smoking status: Some Days  ?  Packs/day: 1.00  ?  Types: Cigarettes  ? Smokeless tobacco: Never  ?Vaping Use  ? Vaping Use: Never used  ?Substance and Sexual Activity  ? Alcohol use: Yes  ?  Comment: occasionally  ? Drug use: No  ? Sexual activity: Not on file  ?Other Topics Concern  ? Not on file  ?Social History Narrative  ? Not on file  ? ?Social Determinants of Health  ? ?Financial Resource Strain: Not on file  ?Food Insecurity: Not on file  ?Transportation Needs: Not on file  ?Physical Activity: Not on file  ?Stress: Not on file  ?Social Connections: Not on file  ? ?  SDOH:  ?SDOH Screenings  ? ?Alcohol Screen: Not on file  ?Depression (PHQ2-9): Medium Risk  ? PHQ-2 Score: 22  ?Financial Resource Strain: Not on file  ?Food Insecurity: Not on file  ?Housing: Not on file  ?Physical Activity: Not on file  ?Social Connections: Not on file  ?Stress: Not on file  ?Tobacco Use: High Risk  ? Smoking Tobacco Use: Some Days  ? Smokeless Tobacco Use: Never  ? Passive Exposure: Not on file  ?Transportation Needs: Not on file  ? ?Additional Social History:  ?  ?Pain Medications: see  MAR ?Prescriptions: see MAR ?Over the Counter: see MAR ?History of alcohol / drug use?: No history of alcohol / drug abuse ?  ?  ?  ?  ?  ?  ?  ?  ?  ? ?Sleep: Fair ? ?Appetite:  Poor ? ?Current Medications:  ?Current Facility-Administered Medications  ?Medication Dose Route Frequency Provider Last Rate Last Admin  ? acetaminophen (TYLENOL) tablet 650 mg  650 mg Oral Q6H PRN Ajibola, Ene A, NP      ? alum & mag hydroxide-simeth (MAALOX/MYLANTA) 200-200-20 MG/5ML suspension 30 mL  30 mL Oral Q4H PRN Ajibola, Ene A, NP      ? hydrOXYzine (ATARAX) tablet 25 mg  25 mg Oral TID PRN Ajibola, Ene A, NP   25 mg at 07/24/21 2117  ? magnesium hydroxide (MILK OF MAGNESIA) suspension 30 mL  30 mL Oral Daily PRN Ajibola, Ene A, NP      ? mirtazapine (REMERON) tablet 7.5 mg  7.5 mg Oral QHS [name], MD   7.5 mg at 07/24/21 2117  ? sertraline (ZOLOFT) tablet 50 mg  50 mg Oral Daily Ajibola, Ene A, NP   50 mg at 07/25/21 0918  ? traZODone (DESYREL) tablet 50 mg  50 mg Oral QHS PRN Ajibola, Ene A, NP   50 mg at 07/24/21 2117  ? ?Current Outpatient Medications  ?Medication Sig Dispense Refill  ? Multiple Vitamin (MULTIVITAMIN WITH MINERALS) TABS tablet Take 1 tablet by mouth daily.    ? mirtazapine (REMERON) 7.5 MG tablet Take 1 tablet (7.5 mg total) by mouth at bedtime. 30 tablet 0  ? traZODone (DESYREL) 50 MG tablet Take 1 tablet (50 mg total) by mouth at bedtime as needed for sleep. 30 tablet 0  ? VISTARIL 25 MG capsule Take 1 capsule (25 mg total) by mouth 3 (three) times daily as needed for anxiety. 30 capsule 0  ? ZOLOFT 50 MG tablet Take 1 tablet (50 mg total) by mouth daily. 30 tablet 0  ? ? ?Labs  ?Lab Results:  ?Admission on 07/22/2021  ?Component Date Value Ref Range Status  ? SARS Coronavirus 2 by RT PCR 07/22/2021 NEGATIVE  NEGATIVE Final  ? Comment: (NOTE) ?SARS-CoV-2 target nucleic acids are NOT DETECTED. ? ?The SARS-CoV-2 RNA is generally detectable in upper respiratory ?specimens during the acute phase  of infection. The lowest ?concentration of SARS-CoV-2 viral copies this assay can detect is ?138 copies/mL. A negative result does not preclude SARS-Cov-2 ?infection and should not be used as the sole basis for treatment or ?other patient management decisions. A negative result may occur with  ?improper specimen collection/handling, submission of specimen other ?than nasopharyngeal swab, presence of viral mutation(s) within the ?areas targeted by this assay, and inadequate number of viral ?copies(<138 copies/mL). A negative result must be combined with ?clinical observations, patient history, and epidemiological ?information. The expected result is Negative. ? ?Fact Sheet for Patients:  ?  BloggerCourse.com ? ?Fact Sheet for Healthcare Providers:  ?SeriousBroker.it ? ?This test is no  ?                        t yet approved or cleared by the [place] FDA and  ?has been authorized for detection and/or diagnosis of SARS-CoV-2 by ?FDA under an Emergency Use Authorization (EUA). This EUA will remain  ?in effect (meaning this test can be used) for the duration of the ?COVID-19 declaration under Section 564(b)(1) of the Act, 21 ?U.S.C.section 360bbb-3(b)(1), unless the authorization is terminated  ?or revoked sooner.  ? ? ?  ? Influenza A by PCR 07/22/2021 NEGATIVE  NEGATIVE Final  ? Influenza B by PCR 07/22/2021 NEGATIVE  NEGATIVE Final  ? Comment: (NOTE) ?The Xpert Xpress SARS-CoV-2/FLU/RSV plus assay is intended as an aid ?in the diagnosis of influenza from Nasopharyngeal swab specimens and ?should not be used as a sole basis for treatment. Nasal washings and ?aspirates are unacceptable for Xpert Xpress SARS-CoV-2/FLU/RSV ?testing. ? ?Fact Sheet for Patients: ?BloggerCourse.com ? ?Fact Sheet for Healthcare Providers: ?SeriousBroker.it ? ?This test is not yet approved or cleared by the [place] FDA and ?has been  authorized for detection and/or diagnosis of SARS-CoV-2 by ?FDA under an Emergency Use Authorization (EUA). This EUA will remain ?in effect (meaning this test can be used) for the duration of the ?COVID-19 declaration under

## 2021-07-25 NOTE — Progress Notes (Signed)
Pt is awake, alert and oriented. Pt did not voice any complaints of pain or discomfort. No signs of acute distress noted. Administered scheduled med with no issue. Pt denies current SI/HI/AVH. Staff will monitor for pt's safety. ?

## 2021-07-25 NOTE — ED Notes (Signed)
Pt in dayroom calm and cooperative. N/o pain or distress. A&O x 4. Will continue to monitor for safety ?

## 2021-07-25 NOTE — ED Notes (Signed)
Pt sleeping@this time. Breathing even and unlabored. Will continue to monitor for safety 

## 2021-07-25 NOTE — ED Notes (Signed)
Pt is currently in the dayroom eating snack, watching television, and socializing with other patients. ?

## 2021-07-25 NOTE — Progress Notes (Signed)
Pt is watching TV and interacting with a peer. No distress noted or concerns voiced. Pt's safety is maintained. ?

## 2021-07-25 NOTE — ED Notes (Signed)
Pt is currently sleeping, no distress noted, environmental check complete, will continue to monitor patient for safety. ? ?

## 2021-07-26 DIAGNOSIS — Z79899 Other long term (current) drug therapy: Secondary | ICD-10-CM | POA: Diagnosis not present

## 2021-07-26 DIAGNOSIS — R45851 Suicidal ideations: Secondary | ICD-10-CM | POA: Diagnosis not present

## 2021-07-26 DIAGNOSIS — Z20822 Contact with and (suspected) exposure to covid-19: Secondary | ICD-10-CM | POA: Diagnosis not present

## 2021-07-26 DIAGNOSIS — F332 Major depressive disorder, recurrent severe without psychotic features: Secondary | ICD-10-CM | POA: Diagnosis not present

## 2021-07-26 NOTE — ED Notes (Signed)
Pt sleeping@this time. Breathing even and unlabored. Will continue to monitor for safety 

## 2021-07-26 NOTE — ED Provider Notes (Signed)
FBC/OBS ASAP Discharge Summary ? ?Date and Time: 07/26/2021 10:53 AM  ?Name: Tim Atkinson  ?MRN:  [id number]  ? ?Discharge Diagnoses:  ?Final diagnoses:  ?Severe episode of recurrent major depressive disorder, without psychotic features (HCC)  ? ? ?Subjective:  ?Patient seen and chart reviewed-he has been medication compliant and has been appropriate with staff and peers on the unit. He describes his mood as "good". He reports that he didn't sleep well last night  and denies SI, HI, AVH. He verbalizes readiness for discharge and is aware that he has an appointment with Dr. Christin Fudge later this afternoon. Discussed with patient that he will be provided with 7 day samples and 30 days of medication at discharge.  ? ?Stay Summary:  ?36 yo male with self reported history of PTSD, anxiety and depression who presented to the [facility] on 07/23/21 for SI and worsening depressive symptoms in the context of a recent break up with his significant other. He was admitted to the observation unit.UDS+marijuana; etoh not collected. He was restarted on his previously prescribed zoloft 50 mg daily on admission. On 4/18 remeron 7.5 mg was started as patient reported ongoing depressed mood and was requesting a medication to assist with appetite. Patient tolerated medications well without SE/AE. Patient requested discharge on 07/26/21 with plans to reside with his grandmother upon discharge; he has follow up with outpatient provider Dr. Christin Fudge 07/26/21 at 4 pm.  ? ?On my interview, day of discharge,  patient is in NAD, alert, oriented, calm, cooperative, and attentive, with normal affect, speech, and behavior. Objectively, there is no evidence of psychosis/ mania (able to converse coherently, linear and goal directed thought, no RIS, no distractibility, not pre-occupied, no FOI, etc) nor depression to the point of suicidality (able to concentrate, affect full and reactive, speech normal r/v/t, no psychomotor retardation/agitation, etc). He  denies SI/HI/AVH.  ? ?Overall, patient appears to be at the point, in the absence of inhibiting or disinhibiting symptoms, where he can successfully move to lesser restrictive setting for care. ? ? ?Total Time spent with patient: 30 minutes ? ?Past Psychiatric History: PTSD, anxiety, depression ?Past Medical History: No past medical history on file.  ?Past Surgical History:  ?Procedure Laterality Date  ? EYE SURGERY    ? TOE SURGERY    ? ?Family History:  ?Family History  ?Problem Relation Age of Onset  ? Diverticulitis Mother   ? ?Family Psychiatric History: Brother with "mental health problems" ?Social History:  ?Social History  ? ?Substance and Sexual Activity  ?Alcohol Use Yes  ? Comment: occasionally  ?   ?Social History  ? ?Substance and Sexual Activity  ?Drug Use No  ?  ?Social History  ? ?Socioeconomic History  ? Marital status: Single  ?  Spouse name: Not on file  ? Number of children: Not on file  ? Years of education: Not on file  ? Highest education level: Not on file  ?Occupational History  ? Not on file  ?Tobacco Use  ? Smoking status: Some Days  ?  Packs/day: 1.00  ?  Types: Cigarettes  ? Smokeless tobacco: Never  ?Vaping Use  ? Vaping Use: Never used  ?Substance and Sexual Activity  ? Alcohol use: Yes  ?  Comment: occasionally  ? Drug use: No  ? Sexual activity: Not on file  ?Other Topics Concern  ? Not on file  ?Social History Narrative  ? Not on file  ? ?Social Determinants of Health  ? ?Financial Resource Strain: Not  on file  ?Food Insecurity: Not on file  ?Transportation Needs: Not on file  ?Physical Activity: Not on file  ?Stress: Not on file  ?Social Connections: Not on file  ? ?SDOH:  ?SDOH Screenings  ? ?Alcohol Screen: Not on file  ?Depression (PHQ2-9): Medium Risk  ? PHQ-2 Score: 22  ?Financial Resource Strain: Not on file  ?Food Insecurity: Not on file  ?Housing: Not on file  ?Physical Activity: Not on file  ?Social Connections: Not on file  ?Stress: Not on file  ?Tobacco Use: High Risk  ?  Smoking Tobacco Use: Some Days  ? Smokeless Tobacco Use: Never  ? Passive Exposure: Not on file  ?Transportation Needs: Not on file  ? ? ?Tobacco Cessation:  Prescription not provided because: declined ? ?Current Medications:  ?Current Facility-Administered Medications  ?Medication Dose Route Frequency Provider Last Rate Last Admin  ? acetaminophen (TYLENOL) tablet 650 mg  650 mg Oral Q6H PRN Ajibola, Ene A, NP      ? alum & mag hydroxide-simeth (MAALOX/MYLANTA) 200-200-20 MG/5ML suspension 30 mL  30 mL Oral Q4H PRN Ajibola, Ene A, NP      ? hydrOXYzine (ATARAX) tablet 25 mg  25 mg Oral TID PRN Ajibola, Ene A, NP   25 mg at 07/25/21 2107  ? magnesium hydroxide (MILK OF MAGNESIA) suspension 30 mL  30 mL Oral Daily PRN Ajibola, Ene A, NP      ? mirtazapine (REMERON) tablet 7.5 mg  7.5 mg Oral QHS [name], MD   7.5 mg at 07/25/21 2107  ? sertraline (ZOLOFT) tablet 50 mg  50 mg Oral Daily Ajibola, Ene A, NP   50 mg at 07/26/21 0906  ? traZODone (DESYREL) tablet 50 mg  50 mg Oral QHS PRN Ajibola, Ene A, NP   50 mg at 07/25/21 2107  ? ?Current Outpatient Medications  ?Medication Sig Dispense Refill  ? Multiple Vitamin (MULTIVITAMIN WITH MINERALS) TABS tablet Take 1 tablet by mouth daily.    ? mirtazapine (REMERON) 7.5 MG tablet Take 1 tablet (7.5 mg total) by mouth at bedtime. 30 tablet 0  ? traZODone (DESYREL) 50 MG tablet Take 1 tablet (50 mg total) by mouth at bedtime as needed for sleep. 30 tablet 0  ? VISTARIL 25 MG capsule Take 1 capsule (25 mg total) by mouth 3 (three) times daily as needed for anxiety. 30 capsule 0  ? ZOLOFT 50 MG tablet Take 1 tablet (50 mg total) by mouth daily. 30 tablet 0  ? ? ?PTA Medications: (Not in a hospital admission) ? ? ?Musculoskeletal  ?Strength & Muscle Tone: within normal limits ?Gait & Station: normal ?Patient leans: N/A ? ?Psychiatric Specialty Exam  ?Presentation  ?General Appearance: Appropriate for Environment; Casual ? ?Eye Contact:Fair ? ?Speech:Clear and  Coherent; Normal Rate ? ?Speech Volume:Normal ? ?Handedness:Right ? ? ?Mood and Affect  ?Mood:Anxious ? ?Affect:Appropriate; Constricted ? ? ?Thought Process  ?Thought Processes:Coherent; Goal Directed ? ?Descriptions of Associations:Intact ? ?Orientation:Full (Time, Place and Person) ? ?Thought Content:Logical ?  Duration of Psychotic Symptoms: Less than six months ? ? Hallucinations:Hallucinations: None ? ?Ideas of Reference:None ? ?Suicidal Thoughts:Suicidal Thoughts: No ? ?Homicidal Thoughts:Homicidal Thoughts: No ? ? ?Sensorium  ?Memory:Immediate Good; Recent Good; Remote Good ? ?Judgment:Fair ? ?Insight:Fair ? ? ?Executive Functions  ?Concentration:Fair ? ?Attention Span:Fair ? ?Recall:Fair ? ?Fund of Knowledge:Good ? ?Language:Good ? ? ?Psychomotor Activity  ?Psychomotor Activity:Psychomotor Activity: Normal ? ? ?Assets  ?Assets:Communication Skills; Desire for Improvement; Resilience; Social Support; Physical Health ? ? ?  Sleep  ?Sleep:Sleep: Fair ? ? ?No data recorded ? ?Physical Exam  ?Physical Exam ?Vitals and nursing note reviewed.  ?Constitutional:   ?   General: He is not in acute distress. ?   Appearance: Normal appearance. He is not ill-appearing, toxic-appearing or diaphoretic.  ?HENT:  ?   Head: Normocephalic.  ?Pulmonary:  ?   Effort: Pulmonary effort is normal.  ?Neurological:  ?   Mental Status: He is alert and oriented to person, place, and time.  ? ?Review of Systems  ?Constitutional:  Negative for chills and fever.  ?Respiratory:  Negative for cough and shortness of breath.   ?Cardiovascular:  Negative for chest pain.  ?Gastrointestinal:  Negative for abdominal pain, constipation, diarrhea and vomiting.  ?Neurological:  Negative for dizziness and headaches.  ?Psychiatric/Behavioral:  Negative for hallucinations and suicidal ideas. The patient has insomnia.   ?Blood pressure 106/80, pulse (!) 51, temperature 98.3 ?F (36.8 ?C), temperature source Oral, resp. rate 18, SpO2 99 %. There is no  height or weight on file to calculate BMI. ? ?Demographic Factors:  ?Male, Low socioeconomic status, and Unemployed ? ?Loss Factors: ?Loss of significant relationship, Legal issues, and Financial problems/change in

## 2021-07-26 NOTE — Progress Notes (Addendum)
Received Tim Atkinson this AM asleep in his bed, he got up to eat breakfast. Afterwards he spoke with the provider. He was compliant with his Zoloft. He endorsed feeling a little anxious this morning, but denied feeling suicidal. He feels safe for discharge and left the unit at 1045 hrs. ?

## 2021-07-26 NOTE — Progress Notes (Incomplete)
Tim Atkinson received his discharged order, the AVS was presented to him and his questions answered. He retrieved his personal belongings and was escorted to the lobby at  ?

## 2021-07-26 NOTE — Discharge Instructions (Addendum)
Prescriptions sent to pharmacy at discharge. Patient agreeable to plan.  Given opportunity to ask questions.  Appears to feel comfortable with discharge denies any current suicidal or homicidal thought. ?Patient is also instructed prior to discharge to: Take all medications as prescribed by his mental healthcare provider. Report any adverse effects and or reactions from the medicines to his outpatient provider promptly. Patient has been instructed & cautioned: To not engage in alcohol and or illegal drug use while on prescription medicines. In the event of worsening symptoms, patient is instructed to call the crisis hotline, 911 and or go to the nearest ED for appropriate evaluation and treatment of symptoms. To follow-up with his/her primary care provider for your other medical issues, concerns and or health care needs.  ?

## 2022-05-26 ENCOUNTER — Other Ambulatory Visit: Payer: Self-pay

## 2022-05-26 ENCOUNTER — Ambulatory Visit (HOSPITAL_COMMUNITY)
Admission: EM | Admit: 2022-05-26 | Discharge: 2022-05-26 | Disposition: A | Discharge status: Home / Self Care | Payer: Medicaid Other | Attending: Emergency Medicine | Admitting: Emergency Medicine

## 2022-05-26 ENCOUNTER — Encounter (HOSPITAL_COMMUNITY): Payer: Self-pay | Admitting: *Deleted

## 2022-05-26 DIAGNOSIS — R059 Cough, unspecified: Secondary | ICD-10-CM | POA: Diagnosis not present

## 2022-05-26 DIAGNOSIS — Z1152 Encounter for screening for COVID-19: Secondary | ICD-10-CM | POA: Insufficient documentation

## 2022-05-26 DIAGNOSIS — J069 Acute upper respiratory infection, unspecified: Secondary | ICD-10-CM | POA: Insufficient documentation

## 2022-05-26 MED ORDER — ACETAMINOPHEN 325 MG PO TABS
975.0000 mg | ORAL_TABLET | Freq: Once | ORAL | Status: AC
  Administered 2022-05-26: 975 mg via ORAL

## 2022-05-26 MED ORDER — ALBUTEROL SULFATE HFA 108 (90 BASE) MCG/ACT IN AERS: 2.0000 | INHALATION_SPRAY | RESPIRATORY_TRACT | 0 refills | Status: DC | PRN

## 2022-05-26 MED ORDER — ACETAMINOPHEN 325 MG PO TABS
ORAL_TABLET | ORAL | Status: AC
  Filled 2022-05-26: qty 3

## 2022-05-26 MED ORDER — PROMETHAZINE-DM 6.25-15 MG/5ML PO SYRP: 5.0000 mL | ORAL_SOLUTION | Freq: Three times a day (TID) | ORAL | 0 refills | Status: DC | PRN

## 2022-05-26 NOTE — ED Provider Notes (Signed)
Short    CSN: [id number] Arrival date & time: 05/26/22  1520      History   Chief Complaint Chief Complaint  Patient presents with   Fever   Cough    HPI Tim Atkinson is a 37 y.o. male.   Fever, cough, body aches and chills since Wednesday, patient on day 4 of illness. Son sick with similar symptoms, he was taken to the ED, but father unsure of testing outcomes. Reports shortness of breath with coughing episodes, have been keeping him up at night. Fever today in clinic.   He does smoke about 5/6 Black and milds per day, no hx of asthma.    Fever Associated symptoms: chills, cough and sore throat   Associated symptoms: no chest pain, no diarrhea, no ear pain, no nausea and no vomiting   Cough Associated symptoms: chills, fever, shortness of breath and sore throat   Associated symptoms: no chest pain and no ear pain     History reviewed. No pertinent past medical history.  Patient Active Problem List   Diagnosis Date Noted   MDD (major depressive disorder), recurrent episode, severe ([place]) 07/23/2021    Past Surgical History:  Procedure Laterality Date   EYE SURGERY     TOE SURGERY         Home Medications    Prior to Admission medications   Medication Sig Start Date End Date Taking? Authorizing Provider  albuterol (VENTOLIN HFA) 108 (90 Base) MCG/ACT inhaler Inhale 2 puffs into the lungs every 4 (four) hours as needed for wheezing or shortness of breath. 05/26/22 06/25/22 Yes Louretta Shorten, [place] N, FNP  promethazine-dextromethorphan (PROMETHAZINE-DM) 6.25-15 MG/5ML syrup Take 5 mLs by mouth 3 (three) times daily as needed for cough. 05/26/22  Yes Louretta Shorten, [place], FNP  mirtazapine (REMERON) 7.5 MG tablet Take 1 tablet (7.5 mg total) by mouth at bedtime. 07/25/21   Ival Bible, MD  Multiple Vitamin (MULTIVITAMIN WITH MINERALS) TABS tablet Take 1 tablet by mouth daily.    [provider]  traZODone (DESYREL) 50 MG tablet Take 1  tablet (50 mg total) by mouth at bedtime as needed for sleep. 07/25/21   Ival Bible, MD  VISTARIL 25 MG capsule Take 1 capsule (25 mg total) by mouth 3 (three) times daily as needed for anxiety. 07/25/21   Ival Bible, MD  ZOLOFT 50 MG tablet Take 1 tablet (50 mg total) by mouth daily. 07/25/21   Ival Bible, MD    Family History Family History  Problem Relation Age of Onset   Diverticulitis Mother     Social History Social History   Tobacco Use   Smoking status: Some Days    Packs/day: 1.00    Types: Cigarettes   Smokeless tobacco: Never  Vaping Use   Vaping Use: Never used  Substance Use Topics   Alcohol use: Yes    Comment: occasionally   Drug use: No     Allergies   Patient has no known allergies.   Review of Systems Review of Systems  Constitutional:  Positive for chills and fever.  HENT:  Positive for sore throat. Negative for ear pain.   Respiratory:  Positive for cough and shortness of breath.   Cardiovascular:  Negative for chest pain.  Gastrointestinal:  Negative for abdominal pain, diarrhea, nausea and vomiting.     Physical Exam Triage Vital Signs ED Triage Vitals  Enc Vitals Group     BP 05/26/22 1611 132/83  Pulse Rate 05/26/22 1611 99     Resp 05/26/22 1611 18     Temp 05/26/22 1611 (!) 100.8 F (38.2 C)     Temp src --      SpO2 05/26/22 1611 95 %     Weight --      Height --      Head Circumference --      Peak Flow --      Pain Score 05/26/22 1609 8     Pain Loc --      Pain Edu? --      Excl. in [place]? --    No data found.  Updated Vital Signs BP 132/83   Pulse 99   Temp (!) 100.8 F (38.2 C)   Resp 18   SpO2 95%   Visual Acuity Right Eye Distance:   Left Eye Distance:   Bilateral Distance:    Right Eye Near:   Left Eye Near:    Bilateral Near:     Physical Exam Vitals and nursing note reviewed.  Constitutional:      General: He is not in acute distress.    Appearance: Normal appearance.  He is well-developed.     Comments: Pleasant 37 year old male who appears stated age.  HENT:     Head: Normocephalic and atraumatic.     Right Ear: Tympanic membrane, ear canal and external ear normal.     Left Ear: Tympanic membrane, ear canal and external ear normal.     Nose: Rhinorrhea present.     Mouth/Throat:     Mouth: Mucous membranes are moist.     Pharynx: Uvula midline. Posterior oropharyngeal erythema present. No oropharyngeal exudate or uvula swelling.     Tonsils: No tonsillar exudate or tonsillar abscesses.  Eyes:     General: Lids are normal.     Conjunctiva/sclera: Conjunctivae normal.  Cardiovascular:     Rate and Rhythm: Normal rate and regular rhythm.     Heart sounds: Normal heart sounds, S1 normal and S2 normal. No murmur heard. Pulmonary:     Effort: Pulmonary effort is normal. No respiratory distress.     Breath sounds: Examination of the right-upper field reveals wheezing. Examination of the left-upper field reveals wheezing. Wheezing present.     Comments: Scant expiratory wheezing in bilateral upper lobes. Abdominal:     Palpations: Abdomen is soft.     Tenderness: There is no abdominal tenderness.  Musculoskeletal:        General: No swelling.     Cervical back: Neck supple.  Lymphadenopathy:     Cervical: Cervical adenopathy present.  Skin:    General: Skin is warm and dry.     Capillary Refill: Capillary refill takes less than 2 seconds.  Neurological:     Mental Status: He is alert.  Psychiatric:        Mood and Affect: Mood normal.        Behavior: Behavior is cooperative.      UC Treatments / Results  Labs (all labs ordered are listed, but only abnormal results are displayed) Labs Reviewed  SARS CORONAVIRUS 2 (TAT 6-24 HRS)    EKG   Radiology No results found.  Procedures Procedures (including critical care time)  Medications Ordered in UC Medications  acetaminophen (TYLENOL) tablet 975 mg (975 mg Oral Given 05/26/22 1623)     Initial Impression / Assessment and Plan / UC Course  I have reviewed the triage vital signs and the nursing notes.  Pertinent  labs & imaging results that were available during my care of the patient were reviewed by me and considered in my medical decision making (see chart for details).  Vital signs and nursing note reviewed, patient is hemodynamically stable.  Provided with Tylenol for fever in clinic.  Discussed flu testing is not indicated as he is out of the window for Tamiflu and isolation.  Opted for COVID-19 tested.  Discussed symptomatic therapy with albuterol inhaler as needed and cough medication at night.  Alternating between Tylenol and ibuprofen as needed for fevers, body aches and discomforts.  Return precautions and follow-up care discussed, patient verbalized understanding.    Final Clinical Impressions(s) / UC Diagnoses   Final diagnoses:  Viral upper respiratory tract infection with cough     Discharge Instructions      Your symptoms are consistent with a viral upper respiratory infection.  Please take albuterol inhaler as needed every 4-6 hours for cough and shortness of breath.  Please use promethazine cough syrup at night to help with cough.  Please do not drink or drive on this medication as it can make you drowsy.  Please alternate between Tylenol and ibuprofen every 6-8 hours as needed for fever, body aches and pains.  Please seek follow-up care with primary care provider or you can return to clinic if no symptom improvement in a week.  Please seek immediate care if you develop chest pain, shortness of breath, or worsening of symptoms.     ED Prescriptions     Medication Sig Dispense Auth. Provider   albuterol (VENTOLIN HFA) 108 (90 Base) MCG/ACT inhaler Inhale 2 puffs into the lungs every 4 (four) hours as needed for wheezing or shortness of breath. 18 g Dashaun Onstott, [place], [place]   promethazine-dextromethorphan (PROMETHAZINE-DM) 6.25-15 MG/5ML syrup Take 5  mLs by mouth 3 (three) times daily as needed for cough. 118 mL Malori Myers, [place] N, [place]      I have reviewed the PDMP during this encounter.   Tawania Daponte, [place], [place] 05/26/22 [phone]

## 2022-05-26 NOTE — ED Triage Notes (Signed)
Pt reports since Wed he has had a fever ,cough and loss of appetite . Pt has recently started having a sore throat.

## 2022-05-26 NOTE — Discharge Instructions (Signed)
Your symptoms are consistent with a viral upper respiratory infection.  Please take albuterol inhaler as needed every 4-6 hours for cough and shortness of breath.  Please use promethazine cough syrup at night to help with cough.  Please do not drink or drive on this medication as it can make you drowsy.  Please alternate between Tylenol and ibuprofen every 6-8 hours as needed for fever, body aches and pains.  Please seek follow-up care with primary care provider or you can return to clinic if no symptom improvement in a week.  Please seek immediate care if you develop chest pain, shortness of breath, or worsening of symptoms.

## 2022-05-27 LAB — SARS CORONAVIRUS 2 (TAT 6-24 HRS): SARS Coronavirus 2: NEGATIVE

## 2022-05-28 ENCOUNTER — Encounter (HOSPITAL_COMMUNITY): Payer: Self-pay

## 2022-05-28 ENCOUNTER — Emergency Department (HOSPITAL_COMMUNITY): Payer: Medicaid Other

## 2022-05-28 ENCOUNTER — Emergency Department (HOSPITAL_COMMUNITY)
Admission: EM | Admit: 2022-05-28 | Discharge: 2022-05-28 | Disposition: A | Discharge status: Home / Self Care | Payer: Medicaid Other | Attending: Emergency Medicine | Admitting: Emergency Medicine

## 2022-05-28 DIAGNOSIS — J101 Influenza due to other identified influenza virus with other respiratory manifestations: Secondary | ICD-10-CM

## 2022-05-28 DIAGNOSIS — Z1152 Encounter for screening for COVID-19: Secondary | ICD-10-CM | POA: Insufficient documentation

## 2022-05-28 DIAGNOSIS — R112 Nausea with vomiting, unspecified: Secondary | ICD-10-CM | POA: Diagnosis present

## 2022-05-28 LAB — RESP PANEL BY RT-PCR (RSV, FLU A&B, COVID)  RVPGX2
Influenza A by PCR: POSITIVE — AB
Influenza B by PCR: NEGATIVE
Resp Syncytial Virus by PCR: NEGATIVE
SARS Coronavirus 2 by RT PCR: NEGATIVE

## 2022-05-28 LAB — CBC WITH DIFFERENTIAL/PLATELET
Abs Immature Granulocytes: 0.13 10*3/uL — ABNORMAL HIGH (ref 0.00–0.07)
Basophils Absolute: 0 10*3/uL (ref 0.0–0.1)
Basophils Relative: 0 %
Eosinophils Absolute: 0 10*3/uL (ref 0.0–0.5)
Eosinophils Relative: 0 %
HCT: 44.4 % (ref 39.0–52.0)
Hemoglobin: 15.6 g/dL (ref 13.0–17.0)
Immature Granulocytes: 1 %
Lymphocytes Relative: 6 %
Lymphs Abs: 0.9 10*3/uL (ref 0.7–4.0)
MCH: 28.2 pg (ref 26.0–34.0)
MCHC: 35.1 g/dL (ref 30.0–36.0)
MCV: 80.3 fL (ref 80.0–100.0)
Monocytes Absolute: 0.7 10*3/uL (ref 0.1–1.0)
Monocytes Relative: 5 %
Neutro Abs: 13 10*3/uL — ABNORMAL HIGH (ref 1.7–7.7)
Neutrophils Relative %: 88 %
Platelets: 307 10*3/uL (ref 150–400)
RBC: 5.53 MIL/uL (ref 4.22–5.81)
RDW: 12.4 % (ref 11.5–15.5)
WBC: 14.8 10*3/uL — ABNORMAL HIGH (ref 4.0–10.5)
nRBC: 0 % (ref 0.0–0.2)

## 2022-05-28 LAB — BASIC METABOLIC PANEL
Anion gap: 12 (ref 5–15)
BUN: 10 mg/dL (ref 6–20)
CO2: 27 mmol/L (ref 22–32)
Calcium: 8.7 mg/dL — ABNORMAL LOW (ref 8.9–10.3)
Chloride: 96 mmol/L — ABNORMAL LOW (ref 98–111)
Creatinine, Ser: 0.68 mg/dL (ref 0.61–1.24)
GFR, Estimated: >60 mL/min (ref >60)
Glucose, Bld: 123 mg/dL — ABNORMAL HIGH (ref 70–99)
Potassium: 3.6 mmol/L (ref 3.5–5.1)
Sodium: 135 mmol/L (ref 135–145)

## 2022-05-28 MED ORDER — ONDANSETRON HCL 4 MG/2ML IJ SOLN
4.0000 mg | Freq: Once | INTRAMUSCULAR | Status: AC
  Administered 2022-05-28: 4 mg via INTRAVENOUS
  Filled 2022-05-28: qty 2

## 2022-05-28 MED ORDER — ONDANSETRON 4 MG PO TBDP
4.0000 mg | ORAL_TABLET | Freq: Once | ORAL | Status: AC
  Administered 2022-05-28: 4 mg via ORAL
  Filled 2022-05-28: qty 1

## 2022-05-28 MED ORDER — SODIUM CHLORIDE 0.9 % IV BOLUS
500.0000 mL | Freq: Once | INTRAVENOUS | Status: AC
  Administered 2022-05-28: 500 mL via INTRAVENOUS

## 2022-05-28 MED ORDER — ONDANSETRON 8 MG PO TBDP: 8.0000 mg | ORAL_TABLET | Freq: Once | ORAL | Status: DC

## 2022-05-28 NOTE — ED Provider Notes (Signed)
[facility] Provider Note   CSN: [id number] Arrival date & time: 05/28/22  1213     History  Chief Complaint  Patient presents with   Vomiting   Nausea    Tim Atkinson is a 37 y.o. male presented for 1 week of nausea and vomiting.  Patient states he was at the urgent care 2 days ago and tested negative for COVID but was not tested for the flu.  Patient states he has had decreased appetite and has not been drinking or eating the past few days.  Patient stated however if he were to eat he would be able to tolerate foods and fluids orally.  Patient states he had fevers and chills at home and has had 1 episode of nonbloody emesis today.  Patient states he feels weak and has a productive cough with yellow mucus.  Patients son was sick last week with a viral illness.  Patient denied shortness of breath, chest pain, syncope, vision changes, neck stiffness, dysuria, diarrhea  Home Medications Prior to Admission medications   Medication Sig Start Date End Date Taking? Authorizing Provider  albuterol (VENTOLIN HFA) 108 (90 Base) MCG/ACT inhaler Inhale 2 puffs into the lungs every 4 (four) hours as needed for wheezing or shortness of breath. 05/26/22 06/25/22  Garrison, [place] N, FNP  mirtazapine (REMERON) 7.5 MG tablet Take 1 tablet (7.5 mg total) by mouth at bedtime. 07/25/21   Ival Bible, MD  Multiple Vitamin (MULTIVITAMIN WITH MINERALS) TABS tablet Take 1 tablet by mouth daily.    [provider]  promethazine-dextromethorphan (PROMETHAZINE-DM) 6.25-15 MG/5ML syrup Take 5 mLs by mouth 3 (three) times daily as needed for cough. 05/26/22   Garrison, [place] N, FNP  traZODone (DESYREL) 50 MG tablet Take 1 tablet (50 mg total) by mouth at bedtime as needed for sleep. 07/25/21   Ival Bible, MD  VISTARIL 25 MG capsule Take 1 capsule (25 mg total) by mouth 3 (three) times daily as needed for anxiety. 07/25/21   Ival Bible, MD  ZOLOFT 50 MG tablet Take 1 tablet (50 mg total) by mouth daily. 07/25/21   Ival Bible, MD      Allergies    Patient has no known allergies.    Review of Systems   Review of Systems See HPI Physical Exam Updated Vital Signs BP 127/83 (BP Location: Left Arm)   Pulse 78   Temp 98.1 F (36.7 C) (Oral)   Resp 15   SpO2 98%  Physical Exam Vitals and nursing note reviewed.  Constitutional:      General: He is not in acute distress.    Appearance: He is well-developed.  HENT:     Head: Normocephalic and atraumatic.     Mouth/Throat:     Mouth: Mucous membranes are moist.  Eyes:     Extraocular Movements: Extraocular movements intact.     Conjunctiva/sclera: Conjunctivae normal.     Pupils: Pupils are equal, round, and reactive to light.  Cardiovascular:     Rate and Rhythm: Normal rate and regular rhythm.     Heart sounds: No murmur heard. Pulmonary:     Effort: Pulmonary effort is normal. No respiratory distress.     Breath sounds: Normal breath sounds.  Abdominal:     Palpations: Abdomen is soft.     Tenderness: There is no abdominal tenderness. There is no guarding or rebound.  Musculoskeletal:        General:  No swelling. Normal range of motion.     Cervical back: Normal range of motion and neck supple.  Skin:    General: Skin is warm and dry.     Capillary Refill: Capillary refill takes less than 2 seconds.  Neurological:     General: No focal deficit present.     Mental Status: He is alert and oriented to person, place, and time.  Psychiatric:        Mood and Affect: Mood normal.     ED Results / Procedures / Treatments   Labs (all labs ordered are listed, but only abnormal results are displayed) Labs Reviewed  RESP PANEL BY RT-PCR (RSV, FLU A&B, COVID)  [id number] - Abnormal; Notable for the following components:      Result Value   Influenza A by PCR POSITIVE (*)    All other components within normal limits  BASIC METABOLIC PANEL -  Abnormal; Notable for the following components:   Chloride 96 (*)    Glucose, Bld 123 (*)    Calcium 8.7 (*)    All other components within normal limits  CBC WITH DIFFERENTIAL/PLATELET - Abnormal; Notable for the following components:   WBC 14.8 (*)    Neutro Abs 13.0 (*)    Abs Immature Granulocytes 0.13 (*)    All other components within normal limits    EKG None  Radiology DG Chest 1 View  Result Date: 05/28/2022 CLINICAL DATA:  cough EXAM: CHEST  1 VIEW COMPARISON:  03/13/2016 FINDINGS: Cardiac silhouette is unremarkable. No pneumothorax or pleural effusion. The lungs are clear. The visualized skeletal structures are unremarkable. IMPRESSION: No acute cardiopulmonary process. Electronically Signed   By: Sammie Bench M.D.   On: 05/28/2022 14:39    Procedures Procedures    Medications Ordered in ED Medications  sodium chloride 0.9 % bolus 500 mL (500 mLs Intravenous New Bag/Given 05/28/22 1419)  ondansetron (ZOFRAN) injection 4 mg (4 mg Intravenous Given 05/28/22 1419)    ED Course/ Medical Decision Making/ A&P                             Medical Decision Making  Tim Atkinson 37 y.o. presented today for nausea/vomiting. Working DDx that I considered at this time includes, but not limited to, URI, pneumonia, meningitis.  Review of prior external notes: 05/26/2022 urgent care  Unique Tests and My Interpretation:  BMP: Unremarkable CBC: Leukocytosis Chest x-ray: No acute cardiopulmonary changes Respiratory panel: Influenza A positive  Discussion with Independent Historian: None  Discussion of Management of Tests: None  Risk: Low:  - based on diagnostic testing/clinical impression and treatment plan  Risk Stratification Score: None  R/o DDx: Pneumonia: Chest x-ray is negative  Plan: Patient presented for nausea and vomiting.  On exam patient's vitals are stable patient did not appear to be in any distress however patient did appear weak.  Labs and  chest x-ray were ordered and patient will be given 500 mL of fluid for rehydration and Zofran for his nausea.  Patient's labs came back positive for influenza A.  Patient will be given a work note and given supportive therapy.  Patient was encouraged to continue taking fluids and food and to monitor symptoms.  Patient may alternate between Tylenol or ibuprofen as needed for pain.  Patient was encouraged to follow-up with primary care provider in the next few days if symptoms or not improved.  Patient is outside the window for  Tamiflu.  On recheck patient also stated Zofran and fluids improved his symptoms.  Patient was given return precautions.patient stable for discharge at this time.  Patient verbalized understanding of plan.         Final Clinical Impression(s) / ED Diagnoses Final diagnoses:  Influenza A    Rx / DC Orders ED Discharge Orders     None         Elvina Sidle 05/28/22 1522    Milton Ferguson, MD 05/28/22 1715

## 2022-05-28 NOTE — ED Triage Notes (Signed)
Pt presents with c/o nausea and vomiting since last week. Pt also reports decreased appetite.

## 2022-05-28 NOTE — Discharge Instructions (Signed)
Please call the family medicine center provided for you to be reevaluated in the next few days if symptoms have not improved.  You may alternate every 6 hours as needed for pain with the 1000 mg Tylenol and 400 mg ibuprofen.  Please continue to take in fluids and have a bland diet.  If symptoms worsen please return to ER.

## 2022-05-30 ENCOUNTER — Encounter (HOSPITAL_COMMUNITY): Payer: Self-pay

## 2022-06-05 ENCOUNTER — Ambulatory Visit (INDEPENDENT_AMBULATORY_CARE_PROVIDER_SITE_OTHER): Payer: Medicaid Other | Admitting: Adult Health

## 2022-06-05 ENCOUNTER — Encounter: Payer: Self-pay | Admitting: Adult Health

## 2022-06-05 VITALS — BP 120/80 | HR 97 | Temp 98.3°F | Ht 66.0 in | Wt 143.0 lb

## 2022-06-05 DIAGNOSIS — F32A Depression, unspecified: Secondary | ICD-10-CM

## 2022-06-05 DIAGNOSIS — F419 Anxiety disorder, unspecified: Secondary | ICD-10-CM

## 2022-06-05 DIAGNOSIS — R058 Other specified cough: Secondary | ICD-10-CM | POA: Diagnosis not present

## 2022-06-05 DIAGNOSIS — Z7689 Persons encountering health services in other specified circumstances: Secondary | ICD-10-CM

## 2022-06-05 MED ORDER — PREDNISONE 10 MG PO TABS: ORAL_TABLET | ORAL | 0 refills | Status: DC

## 2022-06-05 MED ORDER — BENZONATATE 200 MG PO CAPS: 200.0000 mg | ORAL_CAPSULE | Freq: Two times a day (BID) | ORAL | 0 refills | Status: DC | PRN

## 2022-06-05 NOTE — Progress Notes (Signed)
Patient presents to clinic today to establish care.  Acute Concerns: Establish Care   Cough -was seen in the emergency room roughly 8 days ago and diagnosed with influenza A.  He reports all of his symptoms have resolved except for cough and wheezing.  His cough is nonproductive.  He has not had any fevers or chills.  He has been using an albuterol inhaler which helps slightly.  Chronic Issues: Anxiety/Depression - is managed by psychiatrist at family [facility] - managed with Zoloft 50 mg and Vistaril 25 mg. Feels like his symptoms are well controled.   Health Maintenance: Dental -- Does not do routine care  Vision -- Does not do routine care  Immunizations -- UTD  Colonoscopy -- Never had    Past Medical History:  Diagnosis Date   Anxiety and depression     Past Surgical History:  Procedure Laterality Date   EYE SURGERY     TOE SURGERY      Current Outpatient Medications on File Prior to Visit  Medication Sig Dispense Refill   albuterol (VENTOLIN HFA) 108 (90 Base) MCG/ACT inhaler Inhale 2 puffs into the lungs every 4 (four) hours as needed for wheezing or shortness of breath. 18 g 0   Multiple Vitamin (MULTIVITAMIN WITH MINERALS) TABS tablet Take 1 tablet by mouth daily.     VISTARIL 25 MG capsule Take 1 capsule (25 mg total) by mouth 3 (three) times daily as needed for anxiety. 30 capsule 0   ZOLOFT 50 MG tablet Take 1 tablet (50 mg total) by mouth daily. 30 tablet 0   No current facility-administered medications on file prior to visit.    No Known Allergies  Family History  Problem Relation Age of Onset   Diverticulitis Mother    Depression Mother    Anxiety disorder Father    Depression Sister    Cancer Maternal Grandmother     Social History   Socioeconomic History   Marital status: Single    Spouse name: Not on file   Number of children: Not on file   Years of education: Not on file   Highest education level: Not on file  Occupational History    Not on file  Tobacco Use   Smoking status: Former    Packs/day: 1.00    Types: Cigarettes   Smokeless tobacco: Never  Vaping Use   Vaping Use: Never used  Substance and Sexual Activity   Alcohol use: Yes    Comment: occasionally   Drug use: No   Sexual activity: Yes    Partners: Female  Other Topics Concern   Not on file  Social History Narrative   Works at [employer]    Not married    No kids    Social Determinants of Radio broadcast assistant Strain: Not on file  Food Insecurity: Not on file  Transportation Needs: Not on file  Physical Activity: Not on file  Stress: Not on file  Social Connections: Not on file  Intimate Partner Violence: Not on file    Review of Systems  Constitutional: Negative.   HENT: Negative.    Respiratory:  Positive for cough.   Cardiovascular: Negative.   Gastrointestinal: Negative.   Genitourinary: Negative.   Musculoskeletal: Negative.   Skin: Negative.   Neurological: Negative.   Endo/Heme/Allergies: Negative.   Psychiatric/Behavioral:  Positive for depression. The patient is nervous/anxious.     BP 120/80   Pulse 97   Temp 98.3 F (36.8 C) (Oral)  Ht '5\' 6"'$  (1.676 m)   Wt 143 lb (64.9 kg)   SpO2 98%   BMI 23.08 kg/m   Physical Exam Vitals and nursing note reviewed.  Constitutional:      Appearance: Normal appearance.  Cardiovascular:     Rate and Rhythm: Normal rate and regular rhythm.     Heart sounds: Normal heart sounds.  Pulmonary:     Effort: Pulmonary effort is normal.     Breath sounds: No stridor. Wheezing (throughout lung fields) present. No rhonchi or rales.  Skin:    General: Skin is warm and dry.  Neurological:     General: No focal deficit present.     Mental Status: He is alert and oriented to person, place, and time.  Psychiatric:        Mood and Affect: Mood normal.        Behavior: Behavior normal.        Thought Content: Thought content normal.        Judgment: Judgment normal.     Recent  Results (from the past 2160 hour(s))  SARS CORONAVIRUS 2 (TAT 6-24 HRS) Anterior Nasal Swab     Status: None   Collection Time: 05/26/22  4:22 PM   Specimen: Anterior Nasal Swab  Result Value Ref Range   SARS Coronavirus 2 NEGATIVE NEGATIVE    Comment: (NOTE) SARS-CoV-2 target nucleic acids are NOT DETECTED.  The SARS-CoV-2 RNA is generally detectable in upper and lower respiratory specimens during the acute phase of infection. Negative results do not preclude SARS-CoV-2 infection, do not rule out co-infections with other pathogens, and should not be used as the sole basis for treatment or other patient management decisions. Negative results must be combined with clinical observations, patient history, and epidemiological information. The expected result is Negative.  Fact Sheet for Patients: SugarRoll.be  Fact Sheet for Healthcare Providers: https://www.woods-mathews.com/  This test is not yet approved or cleared by the [place] FDA and  has been authorized for detection and/or diagnosis of SARS-CoV-2 by FDA under an Emergency Use Authorization (EUA). This EUA will remain  in effect (meaning this test can be used) for the duration of the COVID-19 declaration under Se ction 564(b)(1) of the Act, 21 U.S.C. section 360bbb-3(b)(1), unless the authorization is terminated or revoked sooner.  Performed at [facility], [place] [address], [place], [place] [address]   Resp panel by RT-PCR (RSV, Flu A&B, Covid) Anterior Nasal Swab     Status: Abnormal   Collection Time: 05/28/22  2:29 PM   Specimen: Anterior Nasal Swab  Result Value Ref Range   SARS Coronavirus 2 by RT PCR NEGATIVE NEGATIVE    Comment: (NOTE) SARS-CoV-2 target nucleic acids are NOT DETECTED.  The SARS-CoV-2 RNA is generally detectable in upper respiratory specimens during the acute phase of infection. The lowest concentration of SARS-CoV-2 viral copies this assay can  detect is 138 copies/mL. A negative result does not preclude SARS-Cov-2 infection and should not be used as the sole basis for treatment or other patient management decisions. A negative result may occur with  improper specimen collection/handling, submission of specimen other than nasopharyngeal swab, presence of viral mutation(s) within the areas targeted by this assay, and inadequate number of viral copies(<138 copies/mL). A negative result must be combined with clinical observations, patient history, and epidemiological information. The expected result is Negative.  Fact Sheet for Patients:  EntrepreneurPulse.com.au  Fact Sheet for Healthcare Providers:  IncredibleEmployment.be  This test is no t yet  approved or cleared by the [place] and  has been authorized for detection and/or diagnosis of SARS-CoV-2 by FDA under an Emergency Use Authorization (EUA). This EUA will remain  in effect (meaning this test can be used) for the duration of the COVID-19 declaration under Section 564(b)(1) of the Act, 21 U.S.C.section 360bbb-3(b)(1), unless the authorization is terminated  or revoked sooner.       Influenza A by PCR POSITIVE (A) NEGATIVE   Influenza B by PCR NEGATIVE NEGATIVE    Comment: (NOTE) The Xpert Xpress SARS-CoV-2/FLU/RSV plus assay is intended as an aid in the diagnosis of influenza from Nasopharyngeal swab specimens and should not be used as a sole basis for treatment. Nasal washings and aspirates are unacceptable for Xpert Xpress SARS-CoV-2/FLU/RSV testing.  Fact Sheet for Patients: EntrepreneurPulse.com.au  Fact Sheet for Healthcare Providers: IncredibleEmployment.be  This test is not yet approved or cleared by the [place] FDA and has been authorized for detection and/or diagnosis of SARS-CoV-2 by FDA under an Emergency Use Authorization (EUA). This EUA will remain in effect  (meaning this test can be used) for the duration of the COVID-19 declaration under Section 564(b)(1) of the Act, 21 U.S.C. section 360bbb-3(b)(1), unless the authorization is terminated or revoked.     Resp Syncytial Virus by PCR NEGATIVE NEGATIVE    Comment: (NOTE) Fact Sheet for Patients: EntrepreneurPulse.com.au  Fact Sheet for Healthcare Providers: IncredibleEmployment.be  This test is not yet approved or cleared by the [place] FDA and has been authorized for detection and/or diagnosis of SARS-CoV-2 by FDA under an Emergency Use Authorization (EUA). This EUA will remain in effect (meaning this test can be used) for the duration of the COVID-19 declaration under Section 564(b)(1) of the Act, 21 U.S.C. section 360bbb-3(b)(1), unless the authorization is terminated or revoked.  Performed at [facility], [place] [address] [place], [place] [id number]   Basic metabolic panel     Status: Abnormal   Collection Time: 05/28/22  2:29 PM  Result Value Ref Range   Sodium 135 135 - 145 mmol/L   Potassium 3.6 3.5 - 5.1 mmol/L   Chloride 96 (L) 98 - 111 mmol/L   CO2 27 22 - 32 mmol/L   Glucose, Bld 123 (H) 70 - 99 mg/dL    Comment: Glucose reference range applies only to samples taken after fasting for at least 8 hours.   BUN 10 6 - 20 mg/dL   Creatinine, Ser 0.68 0.61 - 1.24 mg/dL   Calcium 8.7 (L) 8.9 - 10.3 mg/dL   GFR, Estimated >60 >60 mL/min    Comment: (NOTE) Calculated using the CKD-EPI Creatinine Equation (2021)    Anion gap 12 5 - 15    Comment: Performed at [facility], [place] [address] [place], [place] [address]  CBC with Differential     Status: Abnormal   Collection Time: 05/28/22  2:29 PM  Result Value Ref Range   WBC 14.8 (H) 4.0 - 10.5 K/uL   RBC 5.53 4.22 - 5.81 MIL/uL   Hemoglobin 15.6 13.0 - 17.0 g/dL   HCT 44.4 39.0 - 52.0 %   MCV 80.3 80.0 - 100.0 fL   MCH 28.2 26.0 - 34.0 pg    MCHC 35.1 30.0 - 36.0 g/dL   RDW 12.4 11.5 - 15.5 %   Platelets 307 150 - 400 K/uL   nRBC 0.0 0.0 - 0.2 %   Neutrophils Relative % 88 %   Neutro Abs 13.0 (H)  1.7 - 7.7 K/uL   Lymphocytes Relative 6 %   Lymphs Abs 0.9 0.7 - 4.0 K/uL   Monocytes Relative 5 %   Monocytes Absolute 0.7 0.1 - 1.0 K/uL   Eosinophils Relative 0 %   Eosinophils Absolute 0.0 0.0 - 0.5 K/uL   Basophils Relative 0 %   Basophils Absolute 0.0 0.0 - 0.1 K/uL   WBC Morphology MORPHOLOGY UNREMARKABLE    Immature Granulocytes 1 %   Abs Immature Granulocytes 0.13 (H) 0.00 - 0.07 K/uL    Comment: Performed at [facility], [place] [address] [place], [place] [address]    Assessment/Plan: 1. Encounter to establish care - Follow up for CPE or sooner if needed  2. Post-viral cough syndrome  - predniSONE (DELTASONE) 10 MG tablet; 40 mg x 3 days, 20 mg x 3 days, 10 mg x 3 days  Dispense: 21 tablet; Refill: 0 - benzonatate (TESSALON) 200 MG capsule; Take 1 capsule (200 mg total) by mouth 2 (two) times daily as needed for cough.  Dispense: 20 capsule; Refill: 0 - Continue to use albuterol inhaler as needed  3. Anxiety and depression - Per psychiatry    Dorothyann Peng, NP

## 2022-06-05 NOTE — Patient Instructions (Addendum)
It was great meeting you today    Follow up for your yearly exam   I have sent in prednisone and tessalon for your cough

## 2022-07-03 ENCOUNTER — Ambulatory Visit (INDEPENDENT_AMBULATORY_CARE_PROVIDER_SITE_OTHER): Payer: Medicaid Other | Admitting: Adult Health

## 2022-07-03 VITALS — BP 100/62 | HR 82 | Temp 98.3°F | Ht 66.0 in | Wt 145.0 lb

## 2022-07-03 DIAGNOSIS — Z0000 Encounter for general adult medical examination without abnormal findings: Secondary | ICD-10-CM

## 2022-07-03 DIAGNOSIS — Z23 Encounter for immunization: Secondary | ICD-10-CM | POA: Diagnosis not present

## 2022-07-03 DIAGNOSIS — Z114 Encounter for screening for human immunodeficiency virus [HIV]: Secondary | ICD-10-CM

## 2022-07-03 DIAGNOSIS — Z113 Encounter for screening for infections with a predominantly sexual mode of transmission: Secondary | ICD-10-CM

## 2022-07-03 DIAGNOSIS — F32A Depression, unspecified: Secondary | ICD-10-CM

## 2022-07-03 DIAGNOSIS — Z1159 Encounter for screening for other viral diseases: Secondary | ICD-10-CM | POA: Diagnosis not present

## 2022-07-03 DIAGNOSIS — F419 Anxiety disorder, unspecified: Secondary | ICD-10-CM

## 2022-07-03 NOTE — Progress Notes (Signed)
Subjective:    Patient ID: Tim Atkinson, male    DOB: 07-13-1985, 37 y.o.   MRN: [id number]  HPI Patient presents for yearly preventative medicine examination. He is a pleasant 37 year old male who  has a past medical history of Anxiety and depression.  Anxiety/Depression - [facility] - managed with Zoloft 50 mg and Vistaril 25 mg. Feels like his symptoms are well controled.   All immunizations and health maintenance protocols were reviewed with the patient and needed orders were placed.  Appropriate screening laboratory values were ordered for the patient including screening of hyperlipidemia, renal function and hepatic function.  Medication reconciliation,  past medical history, social history, problem list and allergies were reviewed in detail with the patient  Goals were established with regard to weight loss, exercise, and  diet in compliance with medications  He has no acute complaints   Review of Systems  Constitutional: Negative.   HENT: Negative.    Eyes: Negative.   Respiratory: Negative.    Cardiovascular: Negative.   Gastrointestinal: Negative.   Endocrine: Negative.   Genitourinary: Negative.   Musculoskeletal: Negative.   Skin: Negative.   Allergic/Immunologic: Negative.   Neurological: Negative.   Hematological: Negative.   Psychiatric/Behavioral: Negative.    All other systems reviewed and are negative.  Past Medical History:  Diagnosis Date   Anxiety and depression     Social History   Socioeconomic History   Marital status: Single    Spouse name: Not on file   Number of children: Not on file   Years of education: Not on file   Highest education level: Not on file  Occupational History   Not on file  Tobacco Use   Smoking status: Former    Packs/day: 1    Types: Cigarettes   Smokeless tobacco: Never  Vaping Use   Vaping Use: Never used  Substance and Sexual Activity   Alcohol use: Yes    Comment:  occasionally   Drug use: No   Sexual activity: Yes    Partners: Female  Other Topics Concern   Not on file  Social History Narrative   Works at [employer]    Not married    No kids    Social Determinants of Radio broadcast assistant Strain: Not on file  Food Insecurity: Not on file  Transportation Needs: Not on file  Physical Activity: Not on file  Stress: Not on file  Social Connections: Not on file  Intimate Partner Violence: Not on file    Past Surgical History:  Procedure Laterality Date   EYE SURGERY     TOE SURGERY      Family History  Problem Relation Age of Onset   Diverticulitis Mother    Depression Mother    Anxiety disorder Father    Depression Sister    Cancer Maternal Grandmother     No Known Allergies  Current Outpatient Medications on File Prior to Visit  Medication Sig Dispense Refill   albuterol (VENTOLIN HFA) 108 (90 Base) MCG/ACT inhaler Inhale 2 puffs into the lungs every 4 (four) hours as needed for wheezing or shortness of breath. 18 g 0   benzonatate (TESSALON) 200 MG capsule Take 1 capsule (200 mg total) by mouth 2 (two) times daily as needed for cough. 20 capsule 0   Multiple Vitamin (MULTIVITAMIN WITH MINERALS) TABS tablet Take 1 tablet by mouth daily.     predniSONE (DELTASONE) 10 MG tablet 40  mg x 3 days, 20 mg x 3 days, 10 mg x 3 days 21 tablet 0   VISTARIL 25 MG capsule Take 1 capsule (25 mg total) by mouth 3 (three) times daily as needed for anxiety. 30 capsule 0   ZOLOFT 50 MG tablet Take 1 tablet (50 mg total) by mouth daily. 30 tablet 0   No current facility-administered medications on file prior to visit.    BP 100/62   Pulse 82   Temp 98.3 F (36.8 C) (Oral)   Ht 5\' 6"  (1.676 m)   Wt 145 lb (65.8 kg)   SpO2 96%   BMI 23.40 kg/m       Objective:   Physical Exam Vitals and nursing note reviewed.  Constitutional:      General: He is not in acute distress.    Appearance: Normal appearance. He is not ill-appearing.   HENT:     Head: Normocephalic and atraumatic.     Right Ear: Tympanic membrane, ear canal and external ear normal. There is no impacted cerumen.     Left Ear: Tympanic membrane, ear canal and external ear normal. There is no impacted cerumen.     Nose: Nose normal. No congestion or rhinorrhea.     Mouth/Throat:     Mouth: Mucous membranes are moist.     Pharynx: Oropharynx is clear.  Eyes:     Extraocular Movements: Extraocular movements intact.     Conjunctiva/sclera: Conjunctivae normal.     Pupils: Pupils are equal, round, and reactive to light.  Neck:     Vascular: No carotid bruit.  Cardiovascular:     Rate and Rhythm: Normal rate and regular rhythm.     Pulses: Normal pulses.     Heart sounds: No murmur heard.    No friction rub. No gallop.  Pulmonary:     Effort: Pulmonary effort is normal.     Breath sounds: Normal breath sounds.  Abdominal:     General: Abdomen is flat. Bowel sounds are normal. There is no distension.     Palpations: Abdomen is soft. There is no mass.     Tenderness: There is no abdominal tenderness. There is no guarding or rebound.     Hernia: No hernia is present.  Musculoskeletal:        General: Normal range of motion.     Cervical back: Normal range of motion and neck supple.  Lymphadenopathy:     Cervical: No cervical adenopathy.  Skin:    General: Skin is warm and dry.     Capillary Refill: Capillary refill takes less than 2 seconds.  Neurological:     General: No focal deficit present.     Mental Status: He is alert and oriented to person, place, and time.  Psychiatric:        Mood and Affect: Mood normal.        Behavior: Behavior normal.        Thought Content: Thought content normal.        Judgment: Judgment normal.       Assessment & Plan:  1. Routine general medical examination at a health care facility  - Lipid panel; Future - TSH; Future - CBC; Future - Comprehensive metabolic panel; Future - Tdap given  - Follow up in  one year or sooner if needed 2. Anxiety and depression - Per psychiatry  - Lipid panel; Future - TSH; Future - CBC; Future - Comprehensive metabolic panel; Future  3. Need for hepatitis  C screening test  - Hep C Antibody; Future  4. Encounter for screening for HIV  - HIV Antibody (routine testing w rflx); Future  5. Screening examination for STD (sexually transmitted disease)  - HIV Antibody (routine testing w rflx); Future - Urine cytology ancillary only; Future - RPR; Future  Dorothyann Peng, NP

## 2022-07-03 NOTE — Patient Instructions (Signed)
It was great seeing you today!   We will follow up with you regarding your lab work; please schedule a lab appointment in the next week   Please let me know if you need anything

## 2022-07-04 ENCOUNTER — Other Ambulatory Visit: Payer: Medicaid Other

## 2022-12-18 ENCOUNTER — Ambulatory Visit (INDEPENDENT_AMBULATORY_CARE_PROVIDER_SITE_OTHER): Payer: No Typology Code available for payment source | Admitting: Adult Health

## 2022-12-18 ENCOUNTER — Ambulatory Visit (INDEPENDENT_AMBULATORY_CARE_PROVIDER_SITE_OTHER): Payer: No Typology Code available for payment source

## 2022-12-18 VITALS — BP 122/64 | HR 57 | Temp 98.3°F | Ht 66.0 in | Wt 149.0 lb

## 2022-12-18 DIAGNOSIS — R0789 Other chest pain: Secondary | ICD-10-CM | POA: Diagnosis not present

## 2022-12-18 DIAGNOSIS — M25531 Pain in right wrist: Secondary | ICD-10-CM

## 2022-12-18 DIAGNOSIS — R079 Chest pain, unspecified: Secondary | ICD-10-CM | POA: Diagnosis not present

## 2022-12-18 NOTE — Progress Notes (Addendum)
Subjective:    Patient ID: Tim Atkinson, male    DOB: 10/04/1985, 37 y.o.   MRN: [id number]  HPI  37 year old male who  has a past medical history of Anxiety and depression.  He presents to the office today for an acute issue.  He reports that over the last month he has felt a "pressure" in his chest, mostly on the left side of his chest.  This sensation is intermittent and seems to coincide with anxiety producing events and while at work.  He does report shortness of breath while he was experiencing the episodes of chest pressure.  He is seen behavioral health in a few months ago had his Zoloft changed to Prozac.  He denies radiating pain down his left arm or up his left jawline.  He is able to exercise and perform his ADLs without difficulty.  Furthermore, for an unknown amount of time he has had some mild right wrist pain with what he believes is a bony protrusion.  Does not have any loss of range of motion or grip strength.  Review of Systems See HPI   Past Medical History:  Diagnosis Date   Anxiety and depression     Social History   Socioeconomic History   Marital status: Single    Spouse name: Not on file   Number of children: Not on file   Years of education: Not on file   Highest education level: GED or equivalent  Occupational History   Not on file  Tobacco Use   Smoking status: Former    Current packs/day: 1.00    Types: Cigarettes   Smokeless tobacco: Never  Vaping Use   Vaping status: Never Used  Substance and Sexual Activity   Alcohol use: Yes    Comment: occasionally   Drug use: No   Sexual activity: Yes    Partners: Female  Other Topics Concern   Not on file  Social History Narrative   Works at [employer]    Not married    No kids    Social Determinants of Health   Financial Resource Strain: Medium Risk (12/18/2022)   Overall Financial Resource Strain (CARDIA)    Difficulty of Paying Living Expenses: Somewhat hard  Food Insecurity: Food  Insecurity Present (12/18/2022)   Hunger Vital Sign    Worried About Running Out of Food in the Last Year: Patient declined    Ran Out of Food in the Last Year: Sometimes true  Transportation Needs: Unmet Transportation Needs (12/18/2022)   PRAPARE - Administrator, Civil Service (Medical): Yes    Lack of Transportation (Non-Medical): Yes  Physical Activity: Insufficiently Active (12/18/2022)   Exercise Vital Sign    Days of Exercise per Week: 2 days    Minutes of Exercise per Session: 30 min  Stress: Stress Concern Present (12/18/2022)   [employer] of Occupational Health - Occupational Stress Questionnaire    Feeling of Stress : To some extent  Social Connections: Socially Integrated (12/18/2022)   Social Connection and Isolation Panel [NHANES]    Frequency of Communication with Friends and Family: More than three times a week    Frequency of Social Gatherings with Friends and Family: Three times a week    Attends Religious Services: More than 4 times per year    Active Member of Clubs or Organizations: Yes    Attends Banker Meetings: More than 4 times per year    Marital Status: Living with  partner  Intimate Partner Violence: Not on file    Past Surgical History:  Procedure Laterality Date   EYE SURGERY     TOE SURGERY      Family History  Problem Relation Age of Onset   Diverticulitis Mother    Depression Mother    Anxiety disorder Father    Depression Sister    Cancer Maternal Grandmother     No Known Allergies  Current Outpatient Medications on File Prior to Visit  Medication Sig Dispense Refill   FLUoxetine (PROZAC) 20 MG tablet Take 20 mg by mouth daily.     VISTARIL 25 MG capsule Take 1 capsule (25 mg total) by mouth 3 (three) times daily as needed for anxiety. 30 capsule 0   No current facility-administered medications on file prior to visit.    BP 122/64 (BP Location: Left Arm, Patient Position: Sitting, Cuff Size: Large)    Pulse (!) 57   Temp 98.3 F (36.8 C) (Oral)   Ht 5\' 6"  (1.676 m)   Wt 149 lb (67.6 kg)   SpO2 98%   BMI 24.05 kg/m       Objective:   Physical Exam Vitals and nursing note reviewed.  Cardiovascular:     Rate and Rhythm: Normal rate and regular rhythm.     Pulses: Normal pulses.     Heart sounds: Normal heart sounds.  Pulmonary:     Effort: Pulmonary effort is normal.     Breath sounds: Normal breath sounds.  Musculoskeletal:     Right wrist: Normal. No deformity, tenderness, bony tenderness or snuff box tenderness. Normal range of motion.     Right lower leg: No edema.     Left lower leg: No edema.  Skin:    General: Skin is warm and dry.  Neurological:     General: No focal deficit present.     Mental Status: He is oriented to person, place, and time.  Psychiatric:        Mood and Affect: Mood normal.        Behavior: Behavior normal.        Thought Content: Thought content normal.        Judgment: Judgment normal.        Assessment & Plan:  1. Chest pressure EKG: unchanged from previous tracings, sinus bradycardia. Rate 56 - Seems related to anxiety. Will check chest xray today  - Advised follow up with Psychiatrist.  - EKG 12-Lead - DG Chest 2 View; Future  2. Right wrist pain - No significant deformity noted. Will order xray due to pain  - DG Wrist Complete Right; Future  Shirline Frees, NP

## 2022-12-18 NOTE — Addendum Note (Signed)
Addended by: Nancy Fetter on: 12/18/2022 11:38 AM   Modules accepted: Orders

## 2022-12-25 ENCOUNTER — Other Ambulatory Visit (HOSPITAL_COMMUNITY)
Admission: RE | Admit: 2022-12-25 | Discharge: 2022-12-25 | Disposition: A | Discharge status: Home / Self Care | Payer: MEDICAID | Source: Ambulatory Visit | Attending: Adult Health | Admitting: Adult Health

## 2022-12-25 ENCOUNTER — Other Ambulatory Visit (INDEPENDENT_AMBULATORY_CARE_PROVIDER_SITE_OTHER): Payer: No Typology Code available for payment source

## 2022-12-25 ENCOUNTER — Telehealth: Payer: Self-pay | Admitting: Adult Health

## 2022-12-25 DIAGNOSIS — Z1159 Encounter for screening for other viral diseases: Secondary | ICD-10-CM

## 2022-12-25 DIAGNOSIS — Z0000 Encounter for general adult medical examination without abnormal findings: Secondary | ICD-10-CM

## 2022-12-25 DIAGNOSIS — Z113 Encounter for screening for infections with a predominantly sexual mode of transmission: Secondary | ICD-10-CM | POA: Diagnosis present

## 2022-12-25 DIAGNOSIS — Z114 Encounter for screening for human immunodeficiency virus [HIV]: Secondary | ICD-10-CM

## 2022-12-25 DIAGNOSIS — F419 Anxiety disorder, unspecified: Secondary | ICD-10-CM | POA: Diagnosis not present

## 2022-12-25 DIAGNOSIS — F32A Depression, unspecified: Secondary | ICD-10-CM | POA: Diagnosis not present

## 2022-12-25 LAB — COMPREHENSIVE METABOLIC PANEL WITH GFR
ALT: 14 U/L (ref 0–53)
AST: 19 U/L (ref 0–37)
Albumin: 4.6 g/dL (ref 3.5–5.2)
Alkaline Phosphatase: 54 U/L (ref 39–117)
BUN: 12 mg/dL (ref 6–23)
CO2: 30 meq/L (ref 19–32)
Calcium: 9.6 mg/dL (ref 8.4–10.5)
Chloride: 102 meq/L (ref 96–112)
Creatinine, Ser: 1.06 mg/dL (ref 0.40–1.50)
GFR: 90.89 mL/min (ref >60.00)
Glucose, Bld: 84 mg/dL (ref 70–99)
Potassium: 3.9 meq/L (ref 3.5–5.1)
Sodium: 139 meq/L (ref 135–145)
Total Bilirubin: 0.6 mg/dL (ref 0.2–1.2)
Total Protein: 7.1 g/dL (ref 6.0–8.3)

## 2022-12-25 LAB — TSH: TSH: 0.47 u[IU]/mL (ref 0.35–5.50)

## 2022-12-25 LAB — CBC
HCT: 46.9 % (ref 39.0–52.0)
Hemoglobin: 15.7 g/dL (ref 13.0–17.0)
MCHC: 33.4 g/dL (ref 30.0–36.0)
MCV: 84.6 fl (ref 78.0–100.0)
Platelets: 286 10*3/uL (ref 150.0–400.0)
RBC: 5.54 Mil/uL (ref 4.22–5.81)
RDW: 13.6 % (ref 11.5–15.5)
WBC: 7 10*3/uL (ref 4.0–10.5)

## 2022-12-25 LAB — LIPID PANEL
Cholesterol: 163 mg/dL (ref 0–200)
HDL: 68.8 mg/dL (ref >39.00)
LDL Cholesterol: 85 mg/dL (ref 0–99)
NonHDL: 94.18
Total CHOL/HDL Ratio: 2
Triglycerides: 47 mg/dL (ref 0.0–149.0)
VLDL: 9.4 mg/dL (ref 0.0–40.0)

## 2022-12-25 NOTE — Telephone Encounter (Signed)
Pt states he has not received the results for his wrist xray. He would like a call back to go over the results.

## 2022-12-25 NOTE — Telephone Encounter (Signed)
PT notified that Xray has not been resulted by tech

## 2022-12-26 LAB — RPR: RPR Ser Ql: NONREACTIVE

## 2022-12-26 LAB — URINE CYTOLOGY ANCILLARY ONLY
Chlamydia: NEGATIVE
Comment: NEGATIVE
Comment: NEGATIVE
Comment: NORMAL
Neisseria Gonorrhea: NEGATIVE
Trichomonas: NEGATIVE

## 2022-12-26 LAB — HEPATITIS C ANTIBODY: Hepatitis C Ab: NONREACTIVE

## 2022-12-26 LAB — HIV ANTIBODY (ROUTINE TESTING W REFLEX): HIV 1&2 Ab, 4th Generation: NONREACTIVE

## 2023-02-10 ENCOUNTER — Encounter (HOSPITAL_COMMUNITY): Payer: Self-pay

## 2023-02-10 ENCOUNTER — Ambulatory Visit (HOSPITAL_COMMUNITY)
Admission: EM | Admit: 2023-02-10 | Discharge: 2023-02-11 | Disposition: A | Discharge status: Home / Self Care | Payer: MEDICAID | Attending: Psychiatry | Admitting: Psychiatry

## 2023-02-10 DIAGNOSIS — F1020 Alcohol dependence, uncomplicated: Secondary | ICD-10-CM | POA: Diagnosis not present

## 2023-02-10 DIAGNOSIS — R45851 Suicidal ideations: Secondary | ICD-10-CM | POA: Diagnosis not present

## 2023-02-10 DIAGNOSIS — F330 Major depressive disorder, recurrent, mild: Secondary | ICD-10-CM | POA: Diagnosis not present

## 2023-02-10 DIAGNOSIS — F339 Major depressive disorder, recurrent, unspecified: Secondary | ICD-10-CM

## 2023-02-10 DIAGNOSIS — G4700 Insomnia, unspecified: Secondary | ICD-10-CM | POA: Diagnosis not present

## 2023-02-10 DIAGNOSIS — F1010 Alcohol abuse, uncomplicated: Secondary | ICD-10-CM

## 2023-02-10 LAB — POCT URINE DRUG SCREEN - MANUAL ENTRY (I-SCREEN)
POC Amphetamine UR: NOT DETECTED
POC Buprenorphine (BUP): NOT DETECTED
POC Cocaine UR: NOT DETECTED
POC Marijuana UR: POSITIVE — AB
POC Methadone UR: NOT DETECTED
POC Methamphetamine UR: NOT DETECTED
POC Morphine: NOT DETECTED
POC Oxazepam (BZO): NOT DETECTED
POC Oxycodone UR: NOT DETECTED
POC Secobarbital (BAR): NOT DETECTED

## 2023-02-10 LAB — CBC WITH DIFFERENTIAL/PLATELET
Abs Immature Granulocytes: 0.05 10*3/uL (ref 0.00–0.07)
Basophils Absolute: 0 10*3/uL (ref 0.0–0.1)
Basophils Relative: 0 %
Eosinophils Absolute: 0 10*3/uL (ref 0.0–0.5)
Eosinophils Relative: 0 %
HCT: 44.6 % (ref 39.0–52.0)
Hemoglobin: 15.5 g/dL (ref 13.0–17.0)
Immature Granulocytes: 0 %
Lymphocytes Relative: 15 %
Lymphs Abs: 2.1 10*3/uL (ref 0.7–4.0)
MCH: 28.8 pg (ref 26.0–34.0)
MCHC: 34.8 g/dL (ref 30.0–36.0)
MCV: 82.7 fL (ref 80.0–100.0)
Monocytes Absolute: 0.8 10*3/uL (ref 0.1–1.0)
Monocytes Relative: 6 %
Neutro Abs: 10.5 10*3/uL — ABNORMAL HIGH (ref 1.7–7.7)
Neutrophils Relative %: 79 %
Platelets: 280 10*3/uL (ref 150–400)
RBC: 5.39 MIL/uL (ref 4.22–5.81)
RDW: 13.3 % (ref 11.5–15.5)
WBC: 13.5 10*3/uL — ABNORMAL HIGH (ref 4.0–10.5)
nRBC: 0 % (ref 0.0–0.2)

## 2023-02-10 LAB — COMPREHENSIVE METABOLIC PANEL
ALT: 18 U/L (ref 0–44)
AST: 20 U/L (ref 15–41)
Albumin: 4.6 g/dL (ref 3.5–5.0)
Alkaline Phosphatase: 59 U/L (ref 38–126)
Anion gap: 14 (ref 5–15)
BUN: 14 mg/dL (ref 6–20)
CO2: 24 mmol/L (ref 22–32)
Calcium: 9.9 mg/dL (ref 8.9–10.3)
Chloride: 100 mmol/L (ref 98–111)
Creatinine, Ser: 0.98 mg/dL (ref 0.61–1.24)
GFR, Estimated: >60 mL/min (ref >60)
Glucose, Bld: 105 mg/dL — ABNORMAL HIGH (ref 70–99)
Potassium: 4.1 mmol/L (ref 3.5–5.1)
Sodium: 138 mmol/L (ref 135–145)
Total Bilirubin: 1 mg/dL (ref <1.2)
Total Protein: 7.3 g/dL (ref 6.5–8.1)

## 2023-02-10 LAB — TSH: TSH: 2.518 u[IU]/mL (ref 0.350–4.500)

## 2023-02-10 LAB — ETHANOL: Alcohol, Ethyl (B): <10 mg/dL (ref <10)

## 2023-02-10 MED ORDER — TRAZODONE HCL 50 MG PO TABS
50.0000 mg | ORAL_TABLET | Freq: Every evening | ORAL | Status: DC | PRN
  Administered 2023-02-10: 50 mg via ORAL
  Filled 2023-02-10: qty 1

## 2023-02-10 MED ORDER — ALUM & MAG HYDROXIDE-SIMETH 200-200-20 MG/5ML PO SUSP: 30.0000 mL | ORAL | Status: DC | PRN

## 2023-02-10 MED ORDER — FLUOXETINE HCL 20 MG PO CAPS
20.0000 mg | ORAL_CAPSULE | Freq: Every day | ORAL | Status: DC
  Administered 2023-02-11: 20 mg via ORAL
  Filled 2023-02-10 (×2): qty 1

## 2023-02-10 MED ORDER — HYDROXYZINE HCL 25 MG PO TABS
25.0000 mg | ORAL_TABLET | ORAL | Status: AC
Start: 2023-02-10 — End: 2023-02-10
  Administered 2023-02-10: 25 mg via ORAL
  Filled 2023-02-10: qty 1

## 2023-02-10 MED ORDER — ACETAMINOPHEN 325 MG PO TABS: 650.0000 mg | ORAL_TABLET | Freq: Four times a day (QID) | ORAL | Status: DC | PRN

## 2023-02-10 MED ORDER — ZIPRASIDONE MESYLATE 20 MG IM SOLR: 20.0000 mg | INTRAMUSCULAR | Status: DC | PRN

## 2023-02-10 MED ORDER — MAGNESIUM HYDROXIDE 400 MG/5ML PO SUSP: 30.0000 mL | Freq: Every day | ORAL | Status: DC | PRN

## 2023-02-10 MED ORDER — OLANZAPINE 10 MG PO TBDP: 10.0000 mg | ORAL_TABLET | Freq: Three times a day (TID) | ORAL | Status: DC | PRN

## 2023-02-10 MED ORDER — HYDROXYZINE PAMOATE 25 MG PO CAPS: 25.0000 mg | ORAL_CAPSULE | Freq: Three times a day (TID) | ORAL | Status: DC | PRN

## 2023-02-10 MED ORDER — LORAZEPAM 1 MG PO TABS: 1.0000 mg | ORAL_TABLET | ORAL | Status: DC | PRN

## 2023-02-10 NOTE — ED Provider Notes (Signed)
[facility] Continuous Assessment Admission H&P  Date: 02/11/23 Patient Name: Tim Atkinson MRN: [id number] Chief Complaint: thought of hurting myself and others  Diagnoses:  Final diagnoses:  Suicidal thoughts  Recurrent major depressive disorder, remission status unspecified (HCC)  Alcohol abuse  Insomnia, unspecified type    HPI: Tim Atkinson, 37 y/o male with a history of SI,  MDD, presented to [facility] voluntarily.  Per the patient he is having suicidal thoughts to hurt himself and others when asked what was his suicidal thoughts he said he does not have a definite plan but he is thinking a lot of things.  Per the patient he was hospitalized last year for the same thing.  Patient does appear to be very depressed not really answering questions when asked.  According to patient he lives with his mother, per the patient he disappointed his girlfriend.  He did not want to elaborate on what he meant by he disappointed his girlfriend.  At hoarding to patient he has been drinking alcohol every other day for a while.  Per the patient he is currently taking Prozac and he was also taking trazodone but stopped taking his medicine and he would like to get back on them.  According to patient he does see a provider at the [facility] in [place].   Face-to-face observation of patient, patient is alert and oriented x 4, speech is clear maintain minimal to no eye contact.  Patient appearance is groomed however patient is very depressed, affect is very flat times patient will have his head down and maintained minimal to no eye contact.  Patient endorsed suicidal ideation but did not have an immediate plan but stated he was thinking a lot of things. Patient reports he drinks alcohol on a regular basis every other day and he also smoked marijuana.  According to patient he is currently unemployed.  Patient does not seem to be forthcoming with information.  And is very unclear as to what is going on  however because of patient prior suicide ideation close with patient that I am recommending a patient observation for him this way he can get to see a psychiatrist and a counselor in the morning.  Will also restart patient medication for tonight.  Patient in agreement with plan of care.   Recommend inpatient observation          Total Time spent with patient: 20 minutes  Musculoskeletal  Strength & Muscle Tone: within normal limits Gait & Station: normal Patient leans: N/A  Psychiatric Specialty Exam  Presentation General Appearance:  Casual  Eye Contact: Good  Speech: Clear and Coherent  Speech Volume: Normal  Handedness: Right   Mood and Affect  Mood: Anxious  Affect: Appropriate   Thought Process  Thought Processes: Coherent  Descriptions of Associations:Intact  Orientation:Full (Time, Place and Person)  Thought Content:Logical   Duration of Psychotic Symptoms: No data recorded Hallucinations:Hallucinations: None  Ideas of Reference:None  Suicidal Thoughts:Suicidal Thoughts: Yes, Passive SI Passive Intent and/or Plan: With Intent; Without Plan  Homicidal Thoughts:Homicidal Thoughts: No   Sensorium  Memory: Immediate Fair  Judgment: Fair  Insight: Fair   Chartered certified accountant: Fair  Attention Span: Fair  Recall: [employer] of Knowledge: Fair  Language: Fair   Psychomotor Activity  Psychomotor Activity: Psychomotor Activity: Normal   Assets  Assets: Desire for Improvement   Sleep  Sleep: Sleep: Fair Number of Hours of Sleep: 5   Nutritional Assessment (For OBS and FBC admissions  only) Has the patient had a weight loss or gain of 10 pounds or more in the last 3 months?: No Has the patient had a decrease in food intake/or appetite?: No Does the patient have dental problems?: No Does the patient have eating habits or behaviors that may be indicators of an eating disorder including binging or  inducing vomiting?: No Has the patient recently lost weight without trying?: 0 Has the patient been eating poorly because of a decreased appetite?: 0 Malnutrition Screening Tool Score: 0    Physical Exam HENT:     Head: Normocephalic.     Nose: Nose normal.  Eyes:     Pupils: Pupils are equal, round, and reactive to light.  Cardiovascular:     Rate and Rhythm: Normal rate.  Pulmonary:     Effort: Pulmonary effort is normal.  Musculoskeletal:        General: Normal range of motion.     Cervical back: Normal range of motion.  Skin:    General: Skin is warm.  Neurological:     General: No focal deficit present.     Mental Status: He is alert.  Psychiatric:        Mood and Affect: Mood normal.        Thought Content: Thought content normal.        Judgment: Judgment normal.    Review of Systems  Constitutional: Negative.   HENT: Negative.    Eyes: Negative.   Respiratory: Negative.    Cardiovascular: Negative.   Gastrointestinal: Negative.   Genitourinary: Negative.   Musculoskeletal: Negative.   Skin: Negative.   Neurological: Negative.   Psychiatric/Behavioral:  Positive for depression, substance abuse and suicidal ideas. The patient is nervous/anxious.     Blood pressure 120/80, pulse 64, temperature 98.3 F (36.8 C), temperature source Oral, resp. rate 18, SpO2 97%. There is no height or weight on file to calculate BMI.  Past Psychiatric History: MDD, SI,  ETOH    Is the patient at risk to self? Yes  Has the patient been a risk to self in the past 6 months? Yes .    Has the patient been a risk to self within the distant past? Yes   Is the patient a risk to others? Yes   Has the patient been a risk to others in the past 6 months? No   Has the patient been a risk to others within the distant past? No   Past Medical History: see chart   Family History: unknown   Social History: ETOH, MDD, SI  Last Labs:  Admission on 02/10/2023  Component Date Value Ref  Range Status   WBC 02/10/2023 13.5 (H)  4.0 - 10.5 K/uL Final   RBC 02/10/2023 5.39  4.22 - 5.81 MIL/uL Final   Hemoglobin 02/10/2023 15.5  13.0 - 17.0 g/dL Final   HCT 54/12/8117 44.6  39.0 - 52.0 % Final   MCV 02/10/2023 82.7  80.0 - 100.0 fL Final   MCH 02/10/2023 28.8  26.0 - 34.0 pg Final   MCHC 02/10/2023 34.8  30.0 - 36.0 g/dL Final   RDW 14/78/2956 13.3  11.5 - 15.5 % Final   Platelets 02/10/2023 280  150 - 400 K/uL Final   nRBC 02/10/2023 0.0  0.0 - 0.2 % Final   Neutrophils Relative % 02/10/2023 79  % Final   Neutro Abs 02/10/2023 10.5 (H)  1.7 - 7.7 K/uL Final   Lymphocytes Relative 02/10/2023 15  % Final  Lymphs Abs 02/10/2023 2.1  0.7 - 4.0 K/uL Final   Monocytes Relative 02/10/2023 6  % Final   Monocytes Absolute 02/10/2023 0.8  0.1 - 1.0 K/uL Final   Eosinophils Relative 02/10/2023 0  % Final   Eosinophils Absolute 02/10/2023 0.0  0.0 - 0.5 K/uL Final   Basophils Relative 02/10/2023 0  % Final   Basophils Absolute 02/10/2023 0.0  0.0 - 0.1 K/uL Final   Immature Granulocytes 02/10/2023 0  % Final   Abs Immature Granulocytes 02/10/2023 0.05  0.00 - 0.07 K/uL Final   Performed at [facility], [address] [place], [place] [address]   Sodium 02/10/2023 138  135 - 145 mmol/L Final   Potassium 02/10/2023 4.1  3.5 - 5.1 mmol/L Final   Chloride 02/10/2023 100  98 - 111 mmol/L Final   CO2 02/10/2023 24  22 - 32 mmol/L Final   Glucose, Bld 02/10/2023 105 (H)  70 - 99 mg/dL Final   Glucose reference range applies only to samples taken after fasting for at least 8 hours.   BUN 02/10/2023 14  6 - 20 mg/dL Final   Creatinine, Ser 02/10/2023 0.98  0.61 - 1.24 mg/dL Final   Calcium 34/74/2595 9.9  8.9 - 10.3 mg/dL Final   Total Protein 63/87/5643 7.3  6.5 - 8.1 g/dL Final   Albumin 32/95/1884 4.6  3.5 - 5.0 g/dL Final   AST 16/60/6301 20  15 - 41 U/L Final   ALT 02/10/2023 18  0 - 44 U/L Final   Alkaline Phosphatase 02/10/2023 59  38 - 126 U/L Final   Total Bilirubin  02/10/2023 1.0  <1.2 mg/dL Final   GFR, Estimated 02/10/2023 >60  >60 mL/min Final   Comment: (NOTE) Calculated using the CKD-EPI Creatinine Equation (2021)    Anion gap 02/10/2023 14  5 - 15 Final   Performed at [facility], [address] [place], [place] [address]   Alcohol, Ethyl (B) 02/10/2023 <10  <10 mg/dL Final   Comment: (NOTE) Lowest detectable limit for serum alcohol is 10 mg/dL.  For medical purposes only. Performed at [facility], [address] [place], [place] [address]    TSH 02/10/2023 2.518  0.350 - 4.500 uIU/mL Final   Comment: Performed by a 3rd Generation assay with a functional sensitivity of <=0.01 uIU/mL. Performed at [facility], [address], [place], [place] [address]    POC Amphetamine UR 02/10/2023 None Detected  NONE DETECTED (Cut Off Level 1000 ng/mL) Final   POC Secobarbital (BAR) 02/10/2023 None Detected  NONE DETECTED (Cut Off Level 300 ng/mL) Final   POC Buprenorphine (BUP) 02/10/2023 None Detected  NONE DETECTED (Cut Off Level 10 ng/mL) Final   POC Oxazepam (BZO) 02/10/2023 None Detected  NONE DETECTED (Cut Off Level 300 ng/mL) Final   POC Cocaine UR 02/10/2023 None Detected  NONE DETECTED (Cut Off Level 300 ng/mL) Final   POC Methamphetamine UR 02/10/2023 None Detected  NONE DETECTED (Cut Off Level 1000 ng/mL) Final   POC Morphine 02/10/2023 None Detected  NONE DETECTED (Cut Off Level 300 ng/mL) Final   POC Methadone UR 02/10/2023 None Detected  NONE DETECTED (Cut Off Level 300 ng/mL) Final   POC Oxycodone UR 02/10/2023 None Detected  NONE DETECTED (Cut Off Level 100 ng/mL) Final   POC Marijuana UR 02/10/2023 Positive (A)  NONE DETECTED (Cut Off Level 50 ng/mL) Final  Appointment on 12/25/2022  Component Date Value Ref Range Status  RPR Ser Ql 12/25/2022 NON-REACTIVE  NON-REACTIVE Final   Comment: . No laboratory evidence of syphilis. If recent exposure is suspected, submit a new sample in 2-4 weeks. .    Neisseria  Gonorrhea 12/25/2022 Negative   Final   Chlamydia 12/25/2022 Negative   Final   Trichomonas 12/25/2022 Negative   Final   Comment 12/25/2022 Normal Reference Range Trichomonas - Negative   Final   Comment 12/25/2022 Normal Reference Ranger Chlamydia - Negative   Final   Comment 12/25/2022 Normal Reference Range Neisseria Gonorrhea - Negative   Final   HIV 1&2 Ab, 4th Generation 12/25/2022 NON-REACTIVE  NON-REACTIVE Final   Comment: HIV-1 antigen and HIV-1/HIV-2 antibodies were not detected. There is no laboratory evidence of HIV infection. Marland Kitchen PLEASE NOTE: This information has been disclosed to you from records whose confidentiality may be protected by state law.  If your state requires such protection, then the state law prohibits you from making any further disclosure of the information without the specific written consent of the person to whom it pertains, or as otherwise permitted by law. A general authorization for the release of medical or other information is NOT sufficient for this purpose. . For additional information please refer to http://education.[facility].com/faq/FAQ106 (This link is being provided for informational/ educational purposes only.) . Marland Kitchen The performance of this assay has not been clinically validated in patients less than 82 years old. .    Hepatitis C Ab 12/25/2022 NON-REACTIVE  NON-REACTIVE Final   Comment: . HCV antibody was non-reactive. There is no laboratory  evidence of HCV infection. . In most cases, no further action is required. However, if recent HCV exposure is suspected, a test for HCV RNA (test code 41324) is suggested. . For additional information please refer to http://education.[facility].com/faq/FAQ22v1 (This link is being provided for informational/ educational purposes only.) .    Sodium 12/25/2022 139  135 - 145 mEq/L Final   Potassium 12/25/2022 3.9  3.5 - 5.1 mEq/L Final   Chloride 12/25/2022 102  96 - 112 mEq/L  Final   CO2 12/25/2022 30  19 - 32 mEq/L Final   Glucose, Bld 12/25/2022 84  70 - 99 mg/dL Final   BUN 40/01/2724 12  6 - 23 mg/dL Final   Creatinine, Ser 12/25/2022 1.06  0.40 - 1.50 mg/dL Final   Total Bilirubin 12/25/2022 0.6  0.2 - 1.2 mg/dL Final   Alkaline Phosphatase 12/25/2022 54  39 - 117 U/L Final   AST 12/25/2022 19  0 - 37 U/L Final   ALT 12/25/2022 14  0 - 53 U/L Final   Total Protein 12/25/2022 7.1  6.0 - 8.3 g/dL Final   Albumin 36/64/4034 4.6  3.5 - 5.2 g/dL Final   GFR 74/25/9563 90.89  >60.00 mL/min Final   Calculated using the CKD-EPI Creatinine Equation (2021)   Calcium 12/25/2022 9.6  8.4 - 10.5 mg/dL Final   WBC 87/56/4332 7.0  4.0 - 10.5 K/uL Final   RBC 12/25/2022 5.54  4.22 - 5.81 Mil/uL Final   Platelets 12/25/2022 286.0  150.0 - 400.0 K/uL Final   Hemoglobin 12/25/2022 15.7  13.0 - 17.0 g/dL Final   HCT 95/18/8416 46.9  39.0 - 52.0 % Final   MCV 12/25/2022 84.6  78.0 - 100.0 fl Final   MCHC 12/25/2022 33.4  30.0 - 36.0 g/dL Final   RDW 60/63/0160 13.6  11.5 - 15.5 % Final   TSH 12/25/2022 0.47  0.35 - 5.50 uIU/mL Final   Cholesterol 12/25/2022 163  0 -  200 mg/dL Final   ATP III Classification       Desirable:  < 200 mg/dL               Borderline High:  200 - 239 mg/dL          High:  > = 308 mg/dL   Triglycerides 65/78/4696 47.0  0.0 - 149.0 mg/dL Final   Normal:  <295 mg/dLBorderline High:  150 - 199 mg/dL   HDL 28/41/3244 01.02  >39.00 mg/dL Final   VLDL 72/53/6644 9.4  0.0 - 03.4 mg/dL Final   LDL Cholesterol 12/25/2022 85  0 - 99 mg/dL Final   Total CHOL/HDL Ratio 12/25/2022 2   Final                  Men          Women1/2 Average Risk     3.4          3.3Average Risk          5.0          4.42X Average Risk          9.6          7.13X Average Risk          15.0          11.0                       NonHDL 12/25/2022 94.18   Final   NOTE:  Non-HDL goal should be 30 mg/dL higher than patient's LDL goal (i.e. LDL goal of < 70 mg/dL, would have non-HDL goal  of < 100 mg/dL)    Allergies: Pork-derived products  Medications:  Facility Ordered Medications  Medication   acetaminophen (TYLENOL) tablet 650 mg   alum & mag hydroxide-simeth (MAALOX/MYLANTA) 200-200-20 MG/5ML suspension 30 mL   magnesium hydroxide (MILK OF MAGNESIA) suspension 30 mL   OLANZapine zydis (ZYPREXA) disintegrating tablet 10 mg   And   LORazepam (ATIVAN) tablet 1 mg   And   ziprasidone (GEODON) injection 20 mg   traZODone (DESYREL) tablet 50 mg   FLUoxetine (PROZAC) tablet 20 mg   [COMPLETED] hydrOXYzine (ATARAX) tablet 25 mg   PTA Medications  Medication Sig   VISTARIL 25 MG capsule Take 1 capsule (25 mg total) by mouth 3 (three) times daily as needed for anxiety.   FLUoxetine (PROZAC) 20 MG tablet Take 20 mg by mouth daily.      Medical Decision Making  Inpatient observation   Lab Orders         CBC with Differential/Platelet         Comprehensive metabolic panel         Ethanol         TSH         POCT Urine Drug Screen - (I-Screen)      Meds ordered this encounter  Medications   acetaminophen (TYLENOL) tablet 650 mg   alum & mag hydroxide-simeth (MAALOX/MYLANTA) 200-200-20 MG/5ML suspension 30 mL   magnesium hydroxide (MILK OF MAGNESIA) suspension 30 mL   AND Linked Order Group    OLANZapine zydis (ZYPREXA) disintegrating tablet 10 mg    LORazepam (ATIVAN) tablet 1 mg    ziprasidone (GEODON) injection 20 mg   traZODone (DESYREL) tablet 50 mg   FLUoxetine (PROZAC) tablet 20 mg   DISCONTD: hydrOXYzine (VISTARIL) capsule 25 mg   hydrOXYzine (ATARAX) tablet 25 mg     Recommendations  Based on my evaluation the patient does not appear to have an emergency medical condition.  Sindy Guadeloupe, NP 02/11/23  5:59 AM

## 2023-02-10 NOTE — ED Notes (Signed)
Pt here voluntarily as a walk-in. Says that he drank heavily on Saturday and had a "blackout situation" where he is trying to remember what happened. Pt says he has been depressed and anxious with decreased appetite and fatigue. Pt states he is supposed to start a new job tomorrow but, "I can't. I just need a little time." Pt says that his girlfriend may possibly have broken up with him. "I acted out on Saturday. She may be through with me. She texted me today and told me to get some help." Pt says he is prescribed Prozac and hydroxyzine. Uses marijuana daily for anxiety. Pt is guarded. Says he hasn't taken his sleep medication in a couple of months. "Last time I was here they were giving me Remeron." Pt has depressed, flat affect. Endorses passive SI without a plan. Contracts for safety. Denies HI, AVH and pain. Pt in Adult Bed 4.

## 2023-02-10 NOTE — Progress Notes (Signed)
   02/10/23 1746  [facility] Triage Screening (Walk-ins at [facility] only)  How Did You Hear About [place]? Self  What Is the Reason for Your Visit/Call Today? Tim Atkinson is a 37 year old male presenting as a voluntary walk-in to [facility] seeking medication management and evaluation. Pt reports 2 days ago he was "black out" drunk and did something that he regrets. Pt states he does not want to disclose what he did but states he feels like he needs help. Pt reports drinking "a few shots" of liquor daily and an unknown amount of marijuana. Pt reports his last use of alcohol and marijuana was yesterday, he reports drinking from the night before into yesterday morning. Pt states he is diagnosed with MDD. He states he was prescribed medication for his symptoms and something to help him with sleep. Pt states he stopped  taking the medication to help him sleep a couple months ago, but is interested in restarting this medication. Pt denies past suicide attempts. Pt reports past psychiatric hospitalization in 2023 for MDD. Pt reports seeing a therapist bi-weekly at the [facility]. Pt states he lives with his girlfriend.Pt denies HI and AVH at this time.  How Long Has This Been Causing You Problems? <Week  Have You Recently Had Any Thoughts About Hurting Yourself? Yes  How long ago did you have thoughts about hurting yourself? yesterday  Are You Planning to Commit Suicide/Harm Yourself At This time? No  Have you Recently Had Thoughts About Hurting Someone [name]? No  Are You Planning To Harm Someone At This Time? No  Are you currently experiencing any auditory, visual or other hallucinations? No  Have You Used Any Alcohol or Drugs in the Past 24 Hours? Yes  How long ago did you use Drugs or Alcohol? yesterday  What Did You Use and How Much? alcohol unknown amount, marijuana unknown amount  Do you have any current medical co-morbidities that require immediate attention? No  Clinician description of patient physical  appearance/behavior: depressed mood with congruent affect  What Do You Feel Would Help You the Most Today? Treatment for Depression or other mood problem  If access to [facility] was not available, would you have sought care in the Emergency Department? No  Determination of Need Routine (7 days)  Options For Referral Outpatient Therapy;Medication Management;BH Urgent Care
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
## 2023-02-10 NOTE — ED Notes (Signed)
Pt resting at this hour. No apparent distress. RR even and unlabored. Monitored for safety.  

## 2023-02-11 MED ORDER — TRAZODONE HCL 50 MG PO TABS: 50.0000 mg | ORAL_TABLET | Freq: Every evening | ORAL | 0 refills | Status: DC | PRN

## 2023-02-11 NOTE — ED Notes (Signed)
Pt sleeping@this time breathing even and unlabored will continue to monitor for safety 

## 2023-02-11 NOTE — ED Notes (Signed)
Patient alert & oriented x4. Denies intent to harm self or others when asked. Denies VH but endorses AH. Patient states "just voices", unable to get further clarification at this time. Patient denies any physical complaints when asked. No acute distress noted. Support and encouragement provided. Routine safety checks conducted per facility protocol. Encouraged patient to notify staff if any thoughts of harm towards self or others arise. Patient verbalizes understanding and agreement.
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
## 2023-02-11 NOTE — ED Notes (Signed)
Patient resting in lounger with eyes closed in no apparent acute distress. Respirations even and unlabored. Environment secured. Safety checks in place according to facility policy.

## 2023-02-11 NOTE — ED Notes (Signed)
Patient A&O x 4, ambulatory. Patient discharged in no acute distress. Patient denied SI/HI, A/VH upon discharge. Patient verbalized understanding of all discharge instructions explained by staff, to include follow up appointment recommendations, RX's and safety plan. Patient escorted to lobby via staff. Safety maintained.

## 2023-02-11 NOTE — Discharge Instructions (Addendum)
[facility]  Hours Monday - Friday: Services: 8:00AM - 3:00PM Offices: 8:00AM - 5:00PM  Physical Address [address] [place], [place] [address]   Please use this address for [facility] Mailing Address [address] [address] [place], [place] [address]  The [facility] helps people reconnect This is a safe place to rest, take care of basic needs and access the services and community that make all the difference. Our guests come to the [facility] to take a class, do laundry, meet with a case manager or to get their mail. Sometimes they just need to sit in our dayroom and enjoy a conversation.  Here you will find everything from shower facilities to a computer lab, a mail room, classrooms and meeting spaces.  The IRC helps people reconnect with their own lives and with the community at large.  A caring community setting One of the most exciting aspects of the IRC is that so many individuals and organizations in the community are a part of the everyday experience. Whether it's a hair stylist or law firm offering services right in-house, our partners make the [facility] a truly interactive resource center where services are brought to our guests. The IRC brings together a comprehensive community of talented people who not only want to help solve problems, but also to be a part of our guests' lives.  Integrated Care We take a person-centered approach to assistance that includes: Case management Geneticist, molecular Medical clinic Mental health nurse Referrals  Fundamental Services We start with necessities: Midwife and Armed forces operational officer addresses and mailboxes Replacement IDs Onsite barbershop Storage lockers [place] winter warming center  Self-Sufficiency We connect our guests with: Skilled trade classes Job skills classes Resume and jobs application assistance Interview training GED Scientist, forensic Center [facility]) Monday - Friday 8am - 3pm          Sat & Sun 8am - 2pm [address] [place], [place] [address]   [phone]     www.i[email or web address] IRC offers among other critical resources: showers, laundry, barbershop, phone bank, mailroom, computer lab, medical clinic, gardens and a bike maintenance area.   AREA SHELTERS  [facility] [employer]  (Men & women) [address] [place] [phone]  [facility] [place] (Men/women/families) [address] [place] [phone]   [facility] (Families with children) [phone] [address]  [place] [phone]   [facility] (Domestic Violence Shelter) [address]  [place] [phone]   [facility] (Children ages 98-17) [address]  [place] [phone]   [facility]   (Women & children) [address] [place] [phone]   [facility] (Women/substance abuse) [address]  [place] [phone]   [facility] (Men) [address]  [place] [phone]   [facility] (Men) [address]  [place] [phone]  [employer] (Women) [phone]  [place] [phone]   [facility] (Single women & women with children) [address]  [place] [phone]  [facility] (Men/women/families) [address] [phone]    [facility]   (Domestic Violence shelter) [address]  [place] [phone]   [facility] (Men & women) [address]  [place] [phone]  [facility] (Men) [address]  [employer] [phone]   [facility] (Men) [address]  [place] [phone]  Holiday representative (Single women & families) [address]  [place] [phone]  [facility] (Men/women & families) [address]   [place] [phone]    If you are at risk of losing your housing (throughout [facility]) call the Housing Hotline at 606-087-1708. You may also contact 2-1-1, a FREE service of the [address] that provides information about many resources including housing. Dial 211, or visit online at PooledIncome.pl. [facility]:  [facility] [place], contact Janelle Floor [phone] (men only)                          Huey Romans in [place]:  90 day homeless program for women and men;                             contact Rev. Chambers [phone]  [facility]:  [facility]:  men/women/children [phone]  [facility]:  Shelter in [place], [place] [phone]                       [facility] in [place] [phone]   [facility]:  [facility] for Abused Women, [phone]; (women and children)  [facility]:  [employer], men/women/children; [phone]                           [facility] in [place], [phone]; substance abuse halfway house for men              [facility]; 4 bedroom house in [facility] for homeless women, contact Jenne Campus [phone]               [facility] in [place], [phone] (women and children)               [facility], for abused women and children, 24 hour crisis line, [phone], Jamison O[place]  [facility], halfway house for women, [place], [phone]  [facility]:  [facility], [phone]; open Mon-Thurs from PPJ09 - March 15 when temp is below 32 degrees                              [facility]; [place], [phone]; cell [phone]; open 24/7  [facility]:  Outreach for [place] - Rev Ladona Ridgel [phone]  [place]:  transitional housing for women and children; Arminda Resides [phone] Outpatient psychiatric Services  Walk in hours for medication  management Monday, Wednesday, Thursday, and Friday from 8:00 AM to 11:00 AM Recommend arriving by by 7:30 AM.  It is first come first serve.    Walk in hours for therapy intake Monday and Wednesday only 8:00 AM to 11:00 AM Encouraged to arrive by 7:30 AM.  It is first come first serve   Inpatient patient psychiatric services The [facility] offers comprehensive behavioral heath care services for mental health and substance abuse treatment.  Social work can also assist with referral to or getting you into a rehabilitation program short or long term

## 2023-02-11 NOTE — ED Notes (Signed)
Pt asleep at this hour. No apparent distress. RR even and unlabored. Monitored for safety.  

## 2023-05-24 ENCOUNTER — Ambulatory Visit
Admission: EM | Admit: 2023-05-24 | Discharge: 2023-05-24 | Disposition: A | Discharge status: Home / Self Care | Payer: No Typology Code available for payment source

## 2023-05-24 DIAGNOSIS — J019 Acute sinusitis, unspecified: Secondary | ICD-10-CM

## 2023-05-24 MED ORDER — AMOXICILLIN-POT CLAVULANATE 875-125 MG PO TABS: 1.0000 | ORAL_TABLET | Freq: Two times a day (BID) | ORAL | 0 refills | Status: AC

## 2023-05-24 NOTE — ED Triage Notes (Signed)
"  This started with sinus drainage/pressure and now blowing my nose makes my ears pop, all starting a few wks ago, no fever".

## 2023-06-04 NOTE — ED Provider Notes (Signed)
 Tim Atkinson    CSN: [id number] Arrival date & time: 05/24/23  1049      History   Chief Complaint Chief Complaint  Patient presents with   Sinus Problem    HPI Tim Atkinson is a 38 y.o. male.   Patient here today for evaluation of sinus drainage, ear popping.  Symptoms started a few weeks ago and have not improved.  He denies any fever.  The history is provided by the patient.  Sinus Problem Pertinent negatives include no abdominal pain and no shortness of breath.    Past Medical History:  Diagnosis Date   Anxiety and depression     Patient Active Problem List   Diagnosis Date Noted   MDD (major depressive disorder), recurrent episode, severe (HCC) 07/23/2021    Past Surgical History:  Procedure Laterality Date   EYE SURGERY     TOE SURGERY         Home Medications    Prior to Admission medications   Medication Sig Start Date End Date Taking? Authorizing Provider  amoxicillin-clavulanate (AUGMENTIN) 875-125 MG tablet Take 1 tablet by mouth every 12 (twelve) hours. 05/24/23  Yes [name], PA-C  UNKNOWN TO PATIENT [facility] Cold and Flu   Yes [provider]  FLUoxetine (PROZAC) 20 MG tablet Take 20 mg by mouth daily.   Yes [provider]  VISTARIL 25 MG capsule Take 1 capsule (25 mg total) by mouth 3 (three) times daily as needed for anxiety. 07/25/21   [name], MD    Family History Family History  Problem Relation Age of Onset   Diverticulitis Mother    Depression Mother    Anxiety disorder Father    Depression Sister    Cancer Maternal Grandmother     Social History Social History   Tobacco Use   Smoking status: Former    Current packs/day: 1.00    Types: Cigarettes   Smokeless tobacco: Never  Vaping Use   Vaping status: Never Used  Substance Use Topics   Alcohol use: Yes    Comment: occasionally   Drug use: Not Currently    Types: Marijuana    Comment: daily use for anxiety      Allergies   Pork-derived products   Review of Systems Review of Systems  Constitutional:  Negative for chills and fever.  HENT:  Positive for congestion and ear pain. Negative for sore throat.   Eyes:  Negative for discharge and redness.  Respiratory:  Negative for cough and shortness of breath.   Gastrointestinal:  Negative for abdominal pain, nausea and vomiting.     Physical Exam Triage Vital Signs ED Triage Vitals  Encounter Vitals Group     BP 05/24/23 1105 114/72     Systolic BP Percentile --      Diastolic BP Percentile --      Pulse Rate 05/24/23 1105 78     Resp 05/24/23 1105 18     Temp 05/24/23 1105 97.8 F (36.6 C)     Temp Source 05/24/23 1105 Oral     SpO2 05/24/23 1105 98 %     Weight 05/24/23 1103 149 lb 0.5 oz (67.6 kg)     Height 05/24/23 1103 5\' 6"  (1.676 m)     Head Circumference --      Peak Flow --      Pain Score 05/24/23 1101 0     Pain Loc --      Pain Education --  Exclude from Growth Chart --    No data found.  Updated Vital Signs BP 114/72 (BP Location: Left Arm)   Pulse 78   Temp 97.8 F (36.6 C) (Oral)   Resp 18   Ht 5\' 6"  (1.676 m)   Wt 149 lb 0.5 oz (67.6 kg)   SpO2 98%   BMI 24.05 kg/m   Visual Acuity Right Eye Distance:   Left Eye Distance:   Bilateral Distance:    Right Eye Near:   Left Eye Near:    Bilateral Near:     Physical Exam Vitals and nursing note reviewed.  Constitutional:      General: He is not in acute distress.    Appearance: Normal appearance. He is not ill-appearing.  HENT:     Head: Normocephalic and atraumatic.     Right Ear: Tympanic membrane normal.     Left Ear: Tympanic membrane normal.     Nose: Congestion present.     Mouth/Throat:     Mouth: Mucous membranes are moist.     Pharynx: Oropharynx is clear. No oropharyngeal exudate or posterior oropharyngeal erythema.  Eyes:     Conjunctiva/sclera: Conjunctivae normal.  Cardiovascular:     Rate and Rhythm: Normal rate and  regular rhythm.     Heart sounds: Normal heart sounds. No murmur heard. Pulmonary:     Effort: Pulmonary effort is normal. No respiratory distress.     Breath sounds: Normal breath sounds. No wheezing, rhonchi or rales.  Skin:    General: Skin is warm and dry.  Neurological:     Mental Status: He is alert.  Psychiatric:        Mood and Affect: Mood normal.        Thought Content: Thought content normal.      UC Treatments / Results  Labs (all labs ordered are listed, but only abnormal results are displayed) Labs Reviewed - No data to display  EKG   Radiology No results found.  Procedures Procedures (including critical Atkinson time)  Medications Ordered in UC Medications - No data to display  Initial Impression / Assessment and Plan / UC Course  I have reviewed the triage vital signs and the nursing notes.  Pertinent labs & imaging results that were available during my Atkinson of the patient were reviewed by me and considered in my medical decision making (see chart for details).     Given duration of symptoms we will treat to cover sinusitis.  Encouraged follow-up if no gradual improvement with any further concerns.   Final Clinical Impressions(s) / UC Diagnoses   Final diagnoses:  Acute sinusitis, recurrence not specified, unspecified location   Discharge Instructions   None    ED Prescriptions     Medication Sig Dispense Auth. Provider   amoxicillin-clavulanate (AUGMENTIN) 875-125 MG tablet Take 1 tablet by mouth every 12 (twelve) hours. 14 tablet [name], PA-C      PDMP not reviewed this encounter.   [name], PA-C 06/04/23 1008

## 2023-06-11 ENCOUNTER — Emergency Department (HOSPITAL_COMMUNITY)
Admission: EM | Admit: 2023-06-11 | Discharge: 2023-06-11 | Disposition: A | Discharge status: Home / Self Care | Payer: Worker's Compensation | Attending: Emergency Medicine | Admitting: Emergency Medicine

## 2023-06-11 ENCOUNTER — Emergency Department (HOSPITAL_COMMUNITY): Payer: Worker's Compensation

## 2023-06-11 ENCOUNTER — Other Ambulatory Visit: Payer: Self-pay

## 2023-06-11 DIAGNOSIS — S0990XA Unspecified injury of head, initial encounter: Secondary | ICD-10-CM | POA: Diagnosis present

## 2023-06-11 DIAGNOSIS — W208XXA Other cause of strike by thrown, projected or falling object, initial encounter: Secondary | ICD-10-CM | POA: Diagnosis not present

## 2023-06-11 DIAGNOSIS — Y990 Civilian activity done for income or pay: Secondary | ICD-10-CM | POA: Diagnosis not present

## 2023-06-11 MED ORDER — IBUPROFEN 200 MG PO TABS
600.0000 mg | ORAL_TABLET | Freq: Once | ORAL | Status: AC
  Administered 2023-06-11: 600 mg via ORAL
  Filled 2023-06-11: qty 3

## 2023-06-11 NOTE — ED Triage Notes (Signed)
 Pt arrived via POV. C/o HA following being hit in the head by a wooden pallet yesterday. No LOC, not on blood thinners.

## 2023-06-11 NOTE — Discharge Instructions (Addendum)
 Evaluation today was overall reassuring.  Your CT scan was negative for acute injury.  It is possible you may have had a mild concussion.  Recommend you treat conservatively at home with rest, assertive hydration, low stim environment and you can take ibuprofen and Tylenol for headache.  If you begin to start vomiting, have visual disturbance, weakness or numbness in your extremities, dizziness or any other concerning symptom please return emergency department for further evaluation.

## 2023-06-11 NOTE — ED Provider Triage Note (Signed)
 Emergency Medicine Provider Triage Evaluation Note  ARIK HUSMANN , a 38 y.o. male  was evaluated in triage.  Pt complains of head trauma.  Review of Systems  Positive:  Negative:   Physical Exam  BP 122/73 (BP Location: Left Arm)   Pulse 87   Temp 99.3 F (37.4 C) (Oral)   Resp 16   Ht 5\' 6"  (1.676 m)   Wt 70.3 kg   SpO2 94%   BMI 25.02 kg/m  Gen:   Awake, no distress   Resp:  Normal effort  MSK:   Moves extremities without difficulty  Other:    Medical Decision Making  Medically screening exam initiated at 6:40 PM.  Appropriate orders placed.  Brylon Gardner Candle was informed that the remainder of the evaluation will be completed by another provider, this initial triage assessment does not replace that evaluation, and the importance of remaining in the ED until their evaluation is complete.  Large wood palate fell approx 3 feet and hit patient on the head. Patient stating that he has a HA and some blurry peripheral vision. No infectious symptoms.    Dorthy Cooler, [place] 06/11/23 [phone]

## 2023-06-11 NOTE — ED Provider Notes (Signed)
 [place] EMERGENCY DEPARTMENT AT [facility] Provider Note   CSN: [id number] Arrival date & time: 06/11/23  1748     History  Chief Complaint  Patient presents with   Head Injury   HPI Reef L Bradt is a 38 y.o. male presenting for head injury.  Occurred yesterday.  States he was at work yesterday morning, when the pallet fell off a rack and hit the back of his head.  He continued working like he normally does.  That night when he went to sleep he started have a headache and some pain in the back of his head.  States his eyes felt really dry but denies any visual disturbance.  Denies loss of consciousness and blood thinner use.  Denies dizziness.   Head Injury      Home Medications Prior to Admission medications   Medication Sig Start Date End Date Taking? Authorizing Provider  amoxicillin-clavulanate (AUGMENTIN) 875-125 MG tablet Take 1 tablet by mouth every 12 (twelve) hours. 05/24/23   [name], PA-C  FLUoxetine (PROZAC) 20 MG tablet Take 20 mg by mouth daily.    [provider]  UNKNOWN TO PATIENT [facility] Cold [facility] Flu    [provider]  VISTARIL 25 MG capsule Take 1 capsule (25 mg total) by mouth 3 (three) times daily as needed for anxiety. 07/25/21   [name], MD      Allergies    Pork-derived products    Review of Systems   See HPI  Physical Exam Updated Vital Signs BP 122/73 (BP Location: Left Arm)   Pulse 87   Temp 99.3 F (37.4 C) (Oral)   Resp 16   Ht 5\' 6"  (1.676 m)   Wt 70.3 kg   SpO2 94%   BMI 25.02 kg/m  Physical Exam Vitals and nursing note reviewed.  HENT:     Head: Normocephalic and atraumatic. No raccoon eyes or Battle's sign.     Nose: No rhinorrhea.     Mouth/Throat:     Mouth: Mucous membranes are moist.  Eyes:     General:        Right eye: No discharge.        Left eye: No discharge.     Conjunctiva/sclera: Conjunctivae normal.  Cardiovascular:     Rate and Rhythm: Normal rate and  regular rhythm.     Pulses: Normal pulses.     Heart sounds: Normal heart sounds.  Pulmonary:     Effort: Pulmonary effort is normal.     Breath sounds: Normal breath sounds.  Abdominal:     General: Abdomen is flat.     Palpations: Abdomen is soft.  Skin:    General: Skin is warm and dry.  Neurological:     General: No focal deficit present.     Comments: GCS 15. Speech is goal oriented. No deficits appreciated to CN III-XII; symmetric eyebrow raise, no facial drooping, tongue midline. Patient has equal grip strength bilaterally with 5/5 strength against resistance in all major muscle groups bilaterally. Sensation to light touch intact. Patient moves extremities without ataxia. Normal finger-nose-finger. Patient ambulatory with steady gait.   Psychiatric:        Mood and Affect: Mood normal.     ED Results / Procedures / Treatments   Labs (all labs ordered are listed, but only abnormal results are displayed) Labs Reviewed - No data to display  EKG None  Radiology CT Head Wo Contrast Result Date: 06/11/2023 CLINICAL DATA:  Trauma from being hit in the head by wooden Pallet. EXAM: CT HEAD WITHOUT CONTRAST TECHNIQUE: Contiguous axial images were obtained from the base of the skull through the vertex without intravenous contrast. RADIATION DOSE REDUCTION: This exam was performed according to the departmental dose-optimization program which includes automated exposure control, adjustment of the mA and/or kV according to patient size and/or use of iterative reconstruction technique. COMPARISON:  None Available. FINDINGS: Brain: No intracranial hemorrhage, mass effect, or evidence of acute infarct. No hydrocephalus. No extra-axial fluid collection. Vascular: No hyperdense vessel or unexpected calcification. Skull: No fracture or focal lesion. Sinuses/Orbits: No acute finding. Other: None. IMPRESSION: No acute intracranial abnormality. Electronically Signed   By: Minerva Fester M.D.   On:  06/11/2023 21:44    Procedures Procedures    Medications Ordered in ED Medications  ibuprofen (ADVIL) tablet 600 mg (has no administration in time range)    ED Course/ Medical Decision Making/ A&P                                 Medical Decision Making  38 year old well-appearing male presenting for head injury.  Exam was unremarkable and did not reveal any evidence of trauma to his head and neuroexam was normal.  CT head was negative for acute intracranial abnormality.  Patient is well-appearing, nontoxic and hemodynamically stable.  It is possible he may have sustained a mild concussion.  Advised conservative treatment at home.  Also discussed strict return precautions.  Advise follow-up with PCP.  Recommended that he take NSAIDs for his headache.  Discharged in good condition.        Final Clinical Impression(s) / ED Diagnoses Final diagnoses:  Injury of head, initial encounter    Rx / DC Orders ED Discharge Orders     None         Gareth Eagle, PA-C 06/11/23 2238    Gwyneth Sprout, MD 06/11/23 2317
